# Patient Record
Sex: Male | Born: 1960
Health system: Southern US, Community
[De-identification: ages and names within clinical notes are randomized; demographics above are authoritative.]

## PROBLEM LIST (undated history)

## (undated) DIAGNOSIS — Z860100 Personal history of colon polyps, unspecified: Secondary | ICD-10-CM

## (undated) DIAGNOSIS — K219 Gastro-esophageal reflux disease without esophagitis: Secondary | ICD-10-CM

## (undated) DIAGNOSIS — E785 Hyperlipidemia, unspecified: Secondary | ICD-10-CM

## (undated) DIAGNOSIS — R112 Nausea with vomiting, unspecified: Secondary | ICD-10-CM

## (undated) DIAGNOSIS — Z9889 Other specified postprocedural states: Secondary | ICD-10-CM

## (undated) HISTORY — PX: COLONOSCOPY: SHX5424

## (undated) HISTORY — DX: Personal history of colon polyps, unspecified: Z86.0100

## (undated) HISTORY — PX: EYE SURGERY: SHX253

## (undated) HISTORY — DX: Hyperlipidemia, unspecified: E78.5

## (undated) HISTORY — DX: Personal history of colonic polyps: Z86.010

## (undated) HISTORY — DX: Gastro-esophageal reflux disease without esophagitis: K21.9

## (undated) HISTORY — PX: HERNIA REPAIR: SHX51

## (undated) HISTORY — PX: TONSILLECTOMY: SUR1361

## (undated) HISTORY — PX: RESECTION DISTAL CLAVICAL: SHX5053

## (undated) HISTORY — PX: SHOULDER SURGERY: SHX246

## (undated) HISTORY — PX: WISDOM TOOTH EXTRACTION: SHX21

## (undated) HISTORY — PX: OTHER SURGICAL HISTORY: SHX169

---

## 2003-08-07 ENCOUNTER — Emergency Department (HOSPITAL_COMMUNITY): Admission: EM | Admit: 2003-08-07 | Discharge: 2003-08-07 | Payer: Self-pay
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
## 2003-11-19 ENCOUNTER — Encounter: Admission: RE | Admit: 2003-11-19 | Discharge: 2003-11-19 | Payer: Self-pay | Admitting: Orthopedic Surgery

## 2004-03-21 ENCOUNTER — Ambulatory Visit (HOSPITAL_COMMUNITY): Admission: RE | Admit: 2004-03-21 | Discharge: 2004-03-22 | Payer: Self-pay | Admitting: Orthopedic Surgery

## 2004-03-29 ENCOUNTER — Emergency Department (HOSPITAL_COMMUNITY): Admission: EM | Admit: 2004-03-29 | Discharge: 2004-03-29 | Payer: Self-pay | Admitting: Emergency Medicine

## 2004-03-29 ENCOUNTER — Encounter: Admission: RE | Admit: 2004-03-29 | Discharge: 2004-03-29 | Payer: Self-pay | Admitting: Orthopedic Surgery

## 2004-07-04 ENCOUNTER — Encounter: Admission: RE | Admit: 2004-07-04 | Discharge: 2004-07-04 | Payer: Self-pay | Admitting: Family Medicine

## 2005-03-30 ENCOUNTER — Ambulatory Visit (HOSPITAL_BASED_OUTPATIENT_CLINIC_OR_DEPARTMENT_OTHER): Admission: RE | Admit: 2005-03-30 | Discharge: 2005-03-30 | Payer: Self-pay | Admitting: *Deleted

## 2005-04-19 ENCOUNTER — Encounter: Admission: RE | Admit: 2005-04-19 | Discharge: 2005-04-19 | Payer: Self-pay | Admitting: *Deleted

## 2015-03-20 HISTORY — PX: COLONOSCOPY: SHX5424

## 2015-09-12 DIAGNOSIS — K5730 Diverticulosis of large intestine without perforation or abscess without bleeding: Secondary | ICD-10-CM | POA: Diagnosis not present

## 2015-09-12 DIAGNOSIS — Z86010 Personal history of colonic polyps: Secondary | ICD-10-CM | POA: Diagnosis not present

## 2015-09-12 DIAGNOSIS — D123 Benign neoplasm of transverse colon: Secondary | ICD-10-CM | POA: Diagnosis not present

## 2015-09-12 DIAGNOSIS — K621 Rectal polyp: Secondary | ICD-10-CM | POA: Diagnosis not present

## 2015-09-12 LAB — HM COLONOSCOPY

## 2015-10-18 DIAGNOSIS — M25562 Pain in left knee: Secondary | ICD-10-CM | POA: Diagnosis not present

## 2015-10-18 DIAGNOSIS — K219 Gastro-esophageal reflux disease without esophagitis: Secondary | ICD-10-CM | POA: Diagnosis not present

## 2015-10-18 DIAGNOSIS — H9193 Unspecified hearing loss, bilateral: Secondary | ICD-10-CM | POA: Diagnosis not present

## 2015-10-18 DIAGNOSIS — M25561 Pain in right knee: Secondary | ICD-10-CM | POA: Diagnosis not present

## 2015-12-27 DIAGNOSIS — L821 Other seborrheic keratosis: Secondary | ICD-10-CM | POA: Diagnosis not present

## 2015-12-27 DIAGNOSIS — L820 Inflamed seborrheic keratosis: Secondary | ICD-10-CM | POA: Diagnosis not present

## 2016-01-11 DIAGNOSIS — L821 Other seborrheic keratosis: Secondary | ICD-10-CM | POA: Diagnosis not present

## 2016-01-30 DIAGNOSIS — L821 Other seborrheic keratosis: Secondary | ICD-10-CM | POA: Diagnosis not present

## 2016-06-23 DIAGNOSIS — J019 Acute sinusitis, unspecified: Secondary | ICD-10-CM | POA: Diagnosis not present

## 2016-10-01 DIAGNOSIS — K648 Other hemorrhoids: Secondary | ICD-10-CM | POA: Diagnosis not present

## 2016-10-04 DIAGNOSIS — K648 Other hemorrhoids: Secondary | ICD-10-CM | POA: Diagnosis not present

## 2016-11-12 DIAGNOSIS — T7840XA Allergy, unspecified, initial encounter: Secondary | ICD-10-CM | POA: Diagnosis not present

## 2016-11-12 DIAGNOSIS — T63441A Toxic effect of venom of bees, accidental (unintentional), initial encounter: Secondary | ICD-10-CM | POA: Diagnosis not present

## 2016-12-28 DIAGNOSIS — M79645 Pain in left finger(s): Secondary | ICD-10-CM | POA: Diagnosis not present

## 2016-12-28 DIAGNOSIS — D229 Melanocytic nevi, unspecified: Secondary | ICD-10-CM | POA: Diagnosis not present

## 2016-12-28 DIAGNOSIS — Z23 Encounter for immunization: Secondary | ICD-10-CM | POA: Diagnosis not present

## 2016-12-28 DIAGNOSIS — M79644 Pain in right finger(s): Secondary | ICD-10-CM | POA: Diagnosis not present

## 2017-01-01 DIAGNOSIS — D216 Benign neoplasm of connective and other soft tissue of trunk, unspecified: Secondary | ICD-10-CM | POA: Diagnosis not present

## 2017-01-01 DIAGNOSIS — K30 Functional dyspepsia: Secondary | ICD-10-CM | POA: Diagnosis not present

## 2017-01-01 DIAGNOSIS — D229 Melanocytic nevi, unspecified: Secondary | ICD-10-CM | POA: Diagnosis not present

## 2017-03-19 HISTORY — PX: UPPER GASTROINTESTINAL ENDOSCOPY: SHX188

## 2017-09-05 DIAGNOSIS — R110 Nausea: Secondary | ICD-10-CM | POA: Diagnosis not present

## 2017-09-05 DIAGNOSIS — R1084 Generalized abdominal pain: Secondary | ICD-10-CM | POA: Diagnosis not present

## 2017-09-11 ENCOUNTER — Other Ambulatory Visit: Payer: Self-pay | Admitting: Family Medicine

## 2017-09-11 DIAGNOSIS — R1084 Generalized abdominal pain: Secondary | ICD-10-CM

## 2017-09-30 ENCOUNTER — Ambulatory Visit
Admission: RE | Admit: 2017-09-30 | Discharge: 2017-09-30 | Disposition: A | Discharge status: Home / Self Care | Payer: BLUE CROSS/BLUE SHIELD | Source: Ambulatory Visit | Attending: Family Medicine | Admitting: Family Medicine

## 2017-09-30 DIAGNOSIS — K7689 Other specified diseases of liver: Secondary | ICD-10-CM | POA: Diagnosis not present

## 2017-09-30 DIAGNOSIS — R1084 Generalized abdominal pain: Secondary | ICD-10-CM

## 2017-09-30 MED ORDER — IOPAMIDOL (ISOVUE-300) INJECTION 61%
100.0000 mL | Freq: Once | INTRAVENOUS | Status: AC | PRN
  Administered 2017-09-30: 100 mL via INTRAVENOUS

## 2017-10-23 DIAGNOSIS — R1084 Generalized abdominal pain: Secondary | ICD-10-CM | POA: Diagnosis not present

## 2017-11-04 DIAGNOSIS — Z125 Encounter for screening for malignant neoplasm of prostate: Secondary | ICD-10-CM | POA: Diagnosis not present

## 2017-11-04 DIAGNOSIS — K589 Irritable bowel syndrome without diarrhea: Secondary | ICD-10-CM | POA: Diagnosis not present

## 2017-11-04 DIAGNOSIS — L821 Other seborrheic keratosis: Secondary | ICD-10-CM | POA: Diagnosis not present

## 2017-11-04 DIAGNOSIS — K219 Gastro-esophageal reflux disease without esophagitis: Secondary | ICD-10-CM | POA: Diagnosis not present

## 2017-11-04 DIAGNOSIS — H9190 Unspecified hearing loss, unspecified ear: Secondary | ICD-10-CM | POA: Diagnosis not present

## 2017-11-04 DIAGNOSIS — L570 Actinic keratosis: Secondary | ICD-10-CM | POA: Diagnosis not present

## 2017-11-05 DIAGNOSIS — M9901 Segmental and somatic dysfunction of cervical region: Secondary | ICD-10-CM | POA: Diagnosis not present

## 2017-11-05 DIAGNOSIS — M50122 Cervical disc disorder at C5-C6 level with radiculopathy: Secondary | ICD-10-CM | POA: Diagnosis not present

## 2017-11-05 DIAGNOSIS — M9902 Segmental and somatic dysfunction of thoracic region: Secondary | ICD-10-CM | POA: Diagnosis not present

## 2017-11-07 DIAGNOSIS — M9902 Segmental and somatic dysfunction of thoracic region: Secondary | ICD-10-CM | POA: Diagnosis not present

## 2017-11-07 DIAGNOSIS — M50122 Cervical disc disorder at C5-C6 level with radiculopathy: Secondary | ICD-10-CM | POA: Diagnosis not present

## 2017-11-07 DIAGNOSIS — M9901 Segmental and somatic dysfunction of cervical region: Secondary | ICD-10-CM | POA: Diagnosis not present

## 2017-11-08 DIAGNOSIS — M9901 Segmental and somatic dysfunction of cervical region: Secondary | ICD-10-CM | POA: Diagnosis not present

## 2017-11-08 DIAGNOSIS — M50122 Cervical disc disorder at C5-C6 level with radiculopathy: Secondary | ICD-10-CM | POA: Diagnosis not present

## 2017-11-08 DIAGNOSIS — M9902 Segmental and somatic dysfunction of thoracic region: Secondary | ICD-10-CM | POA: Diagnosis not present

## 2017-11-13 DIAGNOSIS — M9902 Segmental and somatic dysfunction of thoracic region: Secondary | ICD-10-CM | POA: Diagnosis not present

## 2017-11-13 DIAGNOSIS — M9901 Segmental and somatic dysfunction of cervical region: Secondary | ICD-10-CM | POA: Diagnosis not present

## 2017-11-13 DIAGNOSIS — M50122 Cervical disc disorder at C5-C6 level with radiculopathy: Secondary | ICD-10-CM | POA: Diagnosis not present

## 2017-11-15 DIAGNOSIS — M50122 Cervical disc disorder at C5-C6 level with radiculopathy: Secondary | ICD-10-CM | POA: Diagnosis not present

## 2017-11-15 DIAGNOSIS — M9901 Segmental and somatic dysfunction of cervical region: Secondary | ICD-10-CM | POA: Diagnosis not present

## 2017-11-15 DIAGNOSIS — M9902 Segmental and somatic dysfunction of thoracic region: Secondary | ICD-10-CM | POA: Diagnosis not present

## 2017-11-19 DIAGNOSIS — M9901 Segmental and somatic dysfunction of cervical region: Secondary | ICD-10-CM | POA: Diagnosis not present

## 2017-11-19 DIAGNOSIS — M50122 Cervical disc disorder at C5-C6 level with radiculopathy: Secondary | ICD-10-CM | POA: Diagnosis not present

## 2017-11-19 DIAGNOSIS — M9902 Segmental and somatic dysfunction of thoracic region: Secondary | ICD-10-CM | POA: Diagnosis not present

## 2017-11-20 ENCOUNTER — Other Ambulatory Visit: Payer: Self-pay | Admitting: Physician Assistant

## 2017-11-20 DIAGNOSIS — R1013 Epigastric pain: Secondary | ICD-10-CM | POA: Diagnosis not present

## 2017-11-20 DIAGNOSIS — K30 Functional dyspepsia: Secondary | ICD-10-CM | POA: Diagnosis not present

## 2017-11-21 ENCOUNTER — Ambulatory Visit
Admission: RE | Admit: 2017-11-21 | Discharge: 2017-11-21 | Disposition: A | Discharge status: Home / Self Care | Payer: BLUE CROSS/BLUE SHIELD | Source: Ambulatory Visit | Attending: Physician Assistant | Admitting: Physician Assistant

## 2017-11-21 DIAGNOSIS — K760 Fatty (change of) liver, not elsewhere classified: Secondary | ICD-10-CM | POA: Diagnosis not present

## 2017-11-21 DIAGNOSIS — R1013 Epigastric pain: Secondary | ICD-10-CM

## 2017-11-22 DIAGNOSIS — M9902 Segmental and somatic dysfunction of thoracic region: Secondary | ICD-10-CM | POA: Diagnosis not present

## 2017-11-22 DIAGNOSIS — M9901 Segmental and somatic dysfunction of cervical region: Secondary | ICD-10-CM | POA: Diagnosis not present

## 2017-11-22 DIAGNOSIS — M50122 Cervical disc disorder at C5-C6 level with radiculopathy: Secondary | ICD-10-CM | POA: Diagnosis not present

## 2017-11-25 DIAGNOSIS — K228 Other specified diseases of esophagus: Secondary | ICD-10-CM | POA: Diagnosis not present

## 2017-11-25 DIAGNOSIS — K449 Diaphragmatic hernia without obstruction or gangrene: Secondary | ICD-10-CM | POA: Diagnosis not present

## 2017-11-25 DIAGNOSIS — R1033 Periumbilical pain: Secondary | ICD-10-CM | POA: Diagnosis not present

## 2017-11-25 DIAGNOSIS — K209 Esophagitis, unspecified: Secondary | ICD-10-CM | POA: Diagnosis not present

## 2017-11-26 DIAGNOSIS — M9901 Segmental and somatic dysfunction of cervical region: Secondary | ICD-10-CM | POA: Diagnosis not present

## 2017-11-26 DIAGNOSIS — M9902 Segmental and somatic dysfunction of thoracic region: Secondary | ICD-10-CM | POA: Diagnosis not present

## 2017-11-26 DIAGNOSIS — M50122 Cervical disc disorder at C5-C6 level with radiculopathy: Secondary | ICD-10-CM | POA: Diagnosis not present

## 2017-11-28 DIAGNOSIS — M9902 Segmental and somatic dysfunction of thoracic region: Secondary | ICD-10-CM | POA: Diagnosis not present

## 2017-11-28 DIAGNOSIS — M50122 Cervical disc disorder at C5-C6 level with radiculopathy: Secondary | ICD-10-CM | POA: Diagnosis not present

## 2017-11-28 DIAGNOSIS — M9901 Segmental and somatic dysfunction of cervical region: Secondary | ICD-10-CM | POA: Diagnosis not present

## 2017-12-02 ENCOUNTER — Ambulatory Visit (INDEPENDENT_AMBULATORY_CARE_PROVIDER_SITE_OTHER): Payer: BLUE CROSS/BLUE SHIELD | Admitting: Family Medicine

## 2017-12-02 ENCOUNTER — Encounter (INDEPENDENT_AMBULATORY_CARE_PROVIDER_SITE_OTHER): Payer: Self-pay | Admitting: Family Medicine

## 2017-12-02 DIAGNOSIS — R200 Anesthesia of skin: Secondary | ICD-10-CM | POA: Diagnosis not present

## 2017-12-02 DIAGNOSIS — R202 Paresthesia of skin: Secondary | ICD-10-CM

## 2017-12-02 DIAGNOSIS — I808 Phlebitis and thrombophlebitis of other sites: Secondary | ICD-10-CM | POA: Diagnosis not present

## 2017-12-02 MED ORDER — PREDNISONE 10 MG PO TABS: ORAL_TABLET | ORAL | 0 refills | Status: DC

## 2017-12-02 NOTE — Progress Notes (Signed)
   Office Visit Note   Patient: Ian Clark           Date of Birth: Nov 01, 1960           MRN: [id number] Visit Date: 12/02/2017 Requested by: Glenford Bayley, DO [place], [place] [address] PCP: Glenford Bayley, DO  Subjective: Chief Complaint  Patient presents with  . Neck - Pain  . Right Ring Finger - Numbness, Tingling  . Right Little Finger - Numbness, Tingling    HPI: He is a 57 year old right-hand-dominant male seen at the request of Dr. Imagene Sheller for right arm numbness.  Symptoms started about 3 or 4 weeks ago with no injury.  He sees Dr. Imagene Sheller long-term about every 6 weeks for maintenance chiropractic adjustments.  He mentioned that he was having some numbness in his fourth and fifth fingers as well as a little bit of weakness when gripping, and he started going 3 times per week for treatments but his numbness does not seem to be improving.  He denies any neck pain, denies any arm pain.  He cannot think of anything that would have caused this.  He is very active outside, does not spend a lot of time at a computer.  He has never had problems like this before but in the past had issues with left shoulder after clavicle fracture surgery and has done well with maintenance chiropractic since then.              ROS: All other systems were reviewed and are negative.  Objective: Vital Signs: There were no vitals taken for this visit.  Physical Exam:  Right arm: Neck range of motion is full, negative Spurling's test.  No tenderness to palpation in the neck or shoulder.  Full range of motion of the shoulder, elbow, wrist and fingers.  5/5 deltoid, rotator cuff, biceps, triceps, and wrist strength but he does have weakness with adduction of the fifth finger and flexion of the fifth finger compared to the left hand.  There is light touch numbness in the fourth and fifth fingers of the right hand.  Negative Tinel's at the ulnar groove but he does have subluxation of the ulnar nerve with  flexion of the elbow.  This is present in the left elbow as well.  No pain or symptoms with palpation around Guyon's canal.  Imaging: None obtained today.  Per patient report, patient has degenerative changes at C5-6 based on chiropractor x-rays.  Assessment & Plan: 1.  Right fourth and fifth finger numbness and weakness, etiology uncertain. - Prednisone taper.  Continue chiropractic.  Nerve studies if not improving in 3-4 weeks.   Follow-Up Instructions: No follow-ups on file.       Procedures: None   PMFS History: There are no active problems to display for this patient.  History reviewed. No pertinent past medical history.  History reviewed. No pertinent family history.  History reviewed. No pertinent surgical history. Social History   Occupational History  . Not on file  Tobacco Use  . Smoking status: Never Smoker  . Smokeless tobacco: Never Used  Substance and Sexual Activity  . Alcohol use: Not on file  . Drug use: Not on file  . Sexual activity: Not on file

## 2017-12-25 DIAGNOSIS — K449 Diaphragmatic hernia without obstruction or gangrene: Secondary | ICD-10-CM | POA: Diagnosis not present

## 2017-12-25 DIAGNOSIS — K210 Gastro-esophageal reflux disease with esophagitis: Secondary | ICD-10-CM | POA: Diagnosis not present

## 2018-01-06 ENCOUNTER — Telehealth (INDEPENDENT_AMBULATORY_CARE_PROVIDER_SITE_OTHER): Payer: Self-pay | Admitting: Family Medicine

## 2018-01-06 NOTE — Telephone Encounter (Signed)
Please advise thanks.

## 2018-01-06 NOTE — Telephone Encounter (Signed)
Patient left a message stating that the oral steroids did not work and wants to know what the next step would be.  CB#[phone].  Thank you.

## 2018-01-07 ENCOUNTER — Other Ambulatory Visit (INDEPENDENT_AMBULATORY_CARE_PROVIDER_SITE_OTHER): Payer: Self-pay | Admitting: Family Medicine

## 2018-01-07 DIAGNOSIS — R202 Paresthesia of skin: Principal | ICD-10-CM

## 2018-01-07 DIAGNOSIS — R200 Anesthesia of skin: Secondary | ICD-10-CM

## 2018-01-07 NOTE — Telephone Encounter (Signed)
IC to discuss. No answer. LMVM advising per Dr Junius Roads.

## 2018-01-07 NOTE — Telephone Encounter (Signed)
I ordered nerve conduction studies per Dr. Ernestina Patches.

## 2018-01-21 ENCOUNTER — Ambulatory Visit (INDEPENDENT_AMBULATORY_CARE_PROVIDER_SITE_OTHER): Payer: BLUE CROSS/BLUE SHIELD | Admitting: Physical Medicine and Rehabilitation

## 2018-01-21 ENCOUNTER — Encounter (INDEPENDENT_AMBULATORY_CARE_PROVIDER_SITE_OTHER): Payer: Self-pay | Admitting: Physical Medicine and Rehabilitation

## 2018-01-21 DIAGNOSIS — R202 Paresthesia of skin: Secondary | ICD-10-CM | POA: Diagnosis not present

## 2018-01-21 NOTE — Progress Notes (Signed)
Spoke with patient - cancelled the appointment on 01/27/18 with Dr. Junius Roads and rescheduled with Dr. Erlinda Hong on 01/28/18 at 3:15 pm.

## 2018-01-21 NOTE — Procedures (Signed)
EMG & NCV Findings: Evaluation of the right ulnar motor nerve showed decreased conduction velocity (A Elbow-B Elbow, 26 m/s).  The right median (across palm) sensory nerve showed prolonged distal peak latency (Wrist, 4.3 ms) and prolonged distal peak latency (Palm, 2.1 ms).  The right ulnar sensory nerve showed prolonged distal peak latency (4.1 ms), reduced amplitude (9.5 V), and decreased conduction velocity (Wrist-5th Digit, 34 m/s).  All remaining nerves (as indicated in the following tables) were within normal limits.    Needle evaluation of the right first dorsal interosseous muscle showed increased insertional activity and moderately increased spontaneous activity.  The right flexor digitorum profundus and the right flexor carpi ulnaris muscles showed increased insertional activity and increased spontaneous activity.  All remaining muscles (as indicated in the following table) showed no evidence of electrical instability.    Impression: The above electrodiagnostic study is ABNORMAL and reveals evidence of:  1. A severe right ulnar nerve entrapment at the elbow (cubital tunnel syndrome) affecting sensory and motor components.   2. A mild right median nerve entrapment at the wrist affecting sensory components.  This appears asymptomatic.  There is no significant electrodiagnostic evidence of any other focal nerve entrapment, brachial plexopathy or cervical radiculopathy.   Recommendations: 1.  Follow-up with referring physician. 2.  Continue current management of symptoms. 3.  Suggest surgical evaluation.  ___________________________ Laurence Spates FAAPMR Board Certified, American Board of Physical Medicine and Rehabilitation    Nerve Conduction Studies Anti Sensory Summary Table   Stim Site NR Peak (ms) Norm Peak (ms) P-T Amp (V) Norm P-T Amp Site1 Site2 Delta-P (ms) Dist (cm) Vel (m/s) Norm Vel (m/s)  Right Median Acr Palm Anti Sensory (2nd Digit)  32.3C  Wrist    *4.3 <3.6 28.2  >10 Wrist Palm 2.2 0.0    Palm    *2.1 <2.0 31.8         Right Radial Anti Sensory (Base 1st Digit)  33.5C  Wrist    2.0 <3.1 15.7  Wrist Base 1st Digit 2.0 0.0    Right Ulnar Anti Sensory (5th Digit)  32.4C  Wrist    *4.1 <3.7 *9.5 >15.0 Wrist 5th Digit 4.1 14.0 *34 >38   Motor Summary Table   Stim Site NR Onset (ms) Norm Onset (ms) O-P Amp (mV) Norm O-P Amp Site1 Site2 Delta-0 (ms) Dist (cm) Vel (m/s) Norm Vel (m/s)  Right Median Motor (Abd Poll Brev)  33.3C  Wrist    4.2 <4.2 5.5 >5 Elbow Wrist 4.1 23.0 56 >50  Elbow    8.3  6.4         Right Ulnar Motor (Abd Dig Min)  33C  Wrist    3.8 <4.2 8.7 >3 B Elbow Wrist 3.9 22.5 58 >53  B Elbow    7.7  2.9  A Elbow B Elbow 3.7 9.5 *26 >53  A Elbow    11.4  2.2          EMG   Side Muscle Nerve Root Ins Act Fibs Psw Amp Dur Poly Recrt Int Fraser Din Comment  Right 1stDorInt Ulnar C8-T1 *Incr *2+ *2+ Nml Nml 0 Nml Nml   Right Abd Poll Brev Median C8-T1 Nml Nml Nml Nml Nml 0 Nml Nml   Right ExtDigCom   Nml Nml Nml Nml Nml 0 Nml Nml   Right Triceps Radial C6-7-8 Nml Nml Nml Nml Nml 0 Nml Nml   Right FlexDigProf Ulnar C8,T1 *Incr *3+ *3+ Nml Nml 0 Nml Nml  Right FlexCarpiUln Ulnar C8,T1 *Incr *3+ *3+ Nml Nml 0 Nml Nml     Nerve Conduction Studies Anti Sensory Left/Right Comparison   Stim Site L Lat (ms) R Lat (ms) L-R Lat (ms) L Amp (V) R Amp (V) L-R Amp (%) Site1 Site2 L Vel (m/s) R Vel (m/s) L-R Vel (m/s)  Median Acr Palm Anti Sensory (2nd Digit)  32.3C  Wrist  *4.3   28.2  Wrist Palm     Palm  *2.1   31.8        Radial Anti Sensory (Base 1st Digit)  33.5C  Wrist  2.0   15.7  Wrist Base 1st Digit     Ulnar Anti Sensory (5th Digit)  32.4C  Wrist  *4.1   *9.5  Wrist 5th Digit  *34    Motor Left/Right Comparison   Stim Site L Lat (ms) R Lat (ms) L-R Lat (ms) L Amp (mV) R Amp (mV) L-R Amp (%) Site1 Site2 L Vel (m/s) R Vel (m/s) L-R Vel (m/s)  Median Motor (Abd Poll Brev)  33.3C  Wrist  4.2   5.5  Elbow Wrist  56   Elbow  8.3    6.4        Ulnar Motor (Abd Dig Min)  33C  Wrist  3.8   8.7  B Elbow Wrist  58   B Elbow  7.7   2.9  A Elbow B Elbow  *26   A Elbow  11.4   2.2           Waveforms:

## 2018-01-21 NOTE — Progress Notes (Signed)
Ian Clark - 57 y.o. male MRN [id number]  Date of birth: 08-04-1960  Office Visit Note: Visit Date: 01/21/2018 PCP: Glenford Bayley, DO Referred by: Glenford Bayley, DO  Subjective: Chief Complaint  Patient presents with  . Right Hand - Pain   HPI:  DELDRICK LINCH is a 57 y.o. male who comes in today At the request of Dr. Eunice Blase for electrodiagnostic study of the right upper limb.  The patient is right-hand dominant and complains of medium months of progressive worsening right arm with numbness and tingling in the fifth digit and part of the fourth digit on the ulnar side.  This is been really severe and worsening over the last 2 months with progressive numbness.  He does not really endorse much in the way of pain just numbness.  He says he started losing some dexterity and strength with a little finger.  He does not note any specific trauma.  He has no left-sided complaints.  No tingling or numbness in the radial digits.  He denies any frank neck pain or radicular pain.  He has tried chiropractic care and medication management without relief.  He does endorse sitting at the bar that he has in his basement for a good length of time resting in his right elbow on the bar while he talks.  Has any prior electrodiagnostic studies.  ROS Otherwise per HPI.  Assessment & Plan: Visit Diagnoses:  1. Paresthesia of skin     Plan: Impression: The above electrodiagnostic study is ABNORMAL and reveals evidence of:  1. A severe right ulnar nerve entrapment at the elbow (cubital tunnel syndrome) affecting sensory and motor components.   2. A mild right median nerve entrapment at the wrist affecting sensory components.  This appears asymptomatic.  There is no significant electrodiagnostic evidence of any other focal nerve entrapment, brachial plexopathy or cervical radiculopathy.   Recommendations: 1.  Follow-up with referring physician. 2.  Continue current management of symptoms. 3.  Suggest  surgical evaluation.   Meds & Orders: No orders of the defined types were placed in this encounter.   Orders Placed This Encounter  Procedures  . NCV with EMG (electromyography)    Follow-up: Return for Eunice Blase, MD.   Procedures: No procedures performed  EMG & NCV Findings: Evaluation of the right ulnar motor nerve showed decreased conduction velocity (A Elbow-B Elbow, 26 m/s).  The right median (across palm) sensory nerve showed prolonged distal peak latency (Wrist, 4.3 ms) and prolonged distal peak latency (Palm, 2.1 ms).  The right ulnar sensory nerve showed prolonged distal peak latency (4.1 ms), reduced amplitude (9.5 V), and decreased conduction velocity (Wrist-5th Digit, 34 m/s).  All remaining nerves (as indicated in the following tables) were within normal limits.    Needle evaluation of the right first dorsal interosseous muscle showed increased insertional activity and moderately increased spontaneous activity.  The right flexor digitorum profundus and the right flexor carpi ulnaris muscles showed increased insertional activity and increased spontaneous activity.  All remaining muscles (as indicated in the following table) showed no evidence of electrical instability.    Impression: The above electrodiagnostic study is ABNORMAL and reveals evidence of:  1. A severe right ulnar nerve entrapment at the elbow (cubital tunnel syndrome) affecting sensory and motor components.   2. A mild right median nerve entrapment at the wrist affecting sensory components.  This appears asymptomatic.  There is no significant electrodiagnostic evidence of any other focal nerve entrapment, brachial  plexopathy or cervical radiculopathy.   Recommendations: 1.  Follow-up with referring physician. 2.  Continue current management of symptoms. 3.  Suggest surgical evaluation.  ___________________________ Laurence Spates FAAPMR Board Certified, American Board of Physical Medicine and  Rehabilitation    Nerve Conduction Studies Anti Sensory Summary Table   Stim Site NR Peak (ms) Norm Peak (ms) P-T Amp (V) Norm P-T Amp Site1 Site2 Delta-P (ms) Dist (cm) Vel (m/s) Norm Vel (m/s)  Right Median Acr Palm Anti Sensory (2nd Digit)  32.3C  Wrist    *4.3 <3.6 28.2 >10 Wrist Palm 2.2 0.0    Palm    *2.1 <2.0 31.8         Right Radial Anti Sensory (Base 1st Digit)  33.5C  Wrist    2.0 <3.1 15.7  Wrist Base 1st Digit 2.0 0.0    Right Ulnar Anti Sensory (5th Digit)  32.4C  Wrist    *4.1 <3.7 *9.5 >15.0 Wrist 5th Digit 4.1 14.0 *34 >38   Motor Summary Table   Stim Site NR Onset (ms) Norm Onset (ms) O-P Amp (mV) Norm O-P Amp Site1 Site2 Delta-0 (ms) Dist (cm) Vel (m/s) Norm Vel (m/s)  Right Median Motor (Abd Poll Brev)  33.3C  Wrist    4.2 <4.2 5.5 >5 Elbow Wrist 4.1 23.0 56 >50  Elbow    8.3  6.4         Right Ulnar Motor (Abd Dig Min)  33C  Wrist    3.8 <4.2 8.7 >3 B Elbow Wrist 3.9 22.5 58 >53  B Elbow    7.7  2.9  A Elbow B Elbow 3.7 9.5 *26 >53  A Elbow    11.4  2.2          EMG   Side Muscle Nerve Root Ins Act Fibs Psw Amp Dur Poly Recrt Int Fraser Din Comment  Right 1stDorInt Ulnar C8-T1 *Incr *2+ *2+ Nml Nml 0 Nml Nml   Right Abd Poll Brev Median C8-T1 Nml Nml Nml Nml Nml 0 Nml Nml   Right ExtDigCom   Nml Nml Nml Nml Nml 0 Nml Nml   Right Triceps Radial C6-7-8 Nml Nml Nml Nml Nml 0 Nml Nml   Right FlexDigProf Ulnar C8,T1 *Incr *3+ *3+ Nml Nml 0 Nml Nml   Right FlexCarpiUln Ulnar C8,T1 *Incr *3+ *3+ Nml Nml 0 Nml Nml     Nerve Conduction Studies Anti Sensory Left/Right Comparison   Stim Site L Lat (ms) R Lat (ms) L-R Lat (ms) L Amp (V) R Amp (V) L-R Amp (%) Site1 Site2 L Vel (m/s) R Vel (m/s) L-R Vel (m/s)  Median Acr Palm Anti Sensory (2nd Digit)  32.3C  Wrist  *4.3   28.2  Wrist Palm     Palm  *2.1   31.8        Radial Anti Sensory (Base 1st Digit)  33.5C  Wrist  2.0   15.7  Wrist Base 1st Digit     Ulnar Anti Sensory (5th Digit)  32.4C  Wrist  *4.1    *9.5  Wrist 5th Digit  *34    Motor Left/Right Comparison   Stim Site L Lat (ms) R Lat (ms) L-R Lat (ms) L Amp (mV) R Amp (mV) L-R Amp (%) Site1 Site2 L Vel (m/s) R Vel (m/s) L-R Vel (m/s)  Median Motor (Abd Poll Brev)  33.3C  Wrist  4.2   5.5  Elbow Wrist  56   Elbow  8.3   6.4  Ulnar Motor (Abd Dig Min)  33C  Wrist  3.8   8.7  B Elbow Wrist  58   B Elbow  7.7   2.9  A Elbow B Elbow  *26   A Elbow  11.4   2.2           Waveforms:            Clinical History: No specialty comments available.     Objective:  VS:  HT:    WT:   BMI:     BP:   HR: bpm  TEMP: ( )  RESP:  Physical Exam  Constitutional: He is oriented to person, place, and time.  Musculoskeletal: He exhibits no edema or tenderness.  Inspection reveals positive Wartenberg sign on the right hand some atrophy of the first webspace on the right compared to left but no atrophy of the bilateral APB or hand intrinsics. There is no swelling, color changes, allodynia or dystrophic changes. There is 5 out of 5 strength in the bilateral wrist extension and long finger flexion,but mild weakness with finger abduction on the right.  There is decreased sensation to light touch in the ulnar distribution on the right compared to left.  There is a positive Froment sign on the right.  There is a positive Tinel's at the left elbow negative at the wrist  Neurological: He is alert and oriented to person, place, and time. He exhibits normal muscle tone. Coordination abnormal.  Skin: Skin is warm and dry. No rash noted. No erythema.    Ortho Exam Imaging: No results found.

## 2018-01-21 NOTE — Progress Notes (Signed)
 .  Numeric Pain Rating Scale and Functional Assessment Average Pain 0   In the last MONTH (on 0-10 scale) has pain interfered with the following?  1. General activity like being  able to carry out your everyday physical activities such as walking, climbing stairs, carrying groceries, or moving a chair?  Rating(0)

## 2018-01-23 NOTE — Progress Notes (Signed)
Patient has appointment scheduled for Tuesday, 01/28/2018 with Dr. Erlinda Hong.

## 2018-01-27 ENCOUNTER — Ambulatory Visit (INDEPENDENT_AMBULATORY_CARE_PROVIDER_SITE_OTHER): Payer: Self-pay | Admitting: Family Medicine

## 2018-01-28 ENCOUNTER — Ambulatory Visit (INDEPENDENT_AMBULATORY_CARE_PROVIDER_SITE_OTHER): Payer: BLUE CROSS/BLUE SHIELD | Admitting: Orthopaedic Surgery

## 2018-01-28 DIAGNOSIS — G5621 Lesion of ulnar nerve, right upper limb: Secondary | ICD-10-CM | POA: Diagnosis not present

## 2018-01-28 NOTE — Progress Notes (Signed)
   Office Visit Note   Patient: Ian Clark           Date of Birth: 1960/08/11           MRN: [id number] Visit Date: 01/28/2018              Requested by: Glenford Bayley, DO [place], [place] [address] PCP: Glenford Bayley, DO   Assessment & Plan: Visit Diagnoses:  1. Cubital tunnel syndrome on right     Plan: Impression is severe right cubital tunnel syndrome and mild right carpal tunnel syndrome.  He is not really symptomatic from his right carpal tunnel syndrome.  He is very symptomatic from his cubital tunnel syndrome.  Recommendation is for cubital tunnel release.  We discussed the surgery in detail including risks and benefits and rehab and recovery and the goals of surgery.  He understands and wishes to proceed in the near future.  Follow-Up Instructions: Return for 2 week postop visit.   Orders:  No orders of the defined types were placed in this encounter.  No orders of the defined types were placed in this encounter.     Procedures: No procedures performed   Clinical Data: No additional findings.   Subjective: Chief Complaint  Patient presents with  . Right Hand - Follow-up    Per [place],patient had abnormal EMG/NCS of RUE    Ian Clark comes in today for referral of his right cubital tunnel syndrome and mild carpal tunnel syndrome.  These were recently found on nerve conduction studies.  He is right-hand dominant gentleman and he has noticed hand dysfunction in terms of strength and dexterity in the last 2 months.  Denies any injuries.  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  Denies any radiation of pain.  He does endorse numbness in his ulnar nerve symptoms.  He denies any carpal tunnel symptoms.   Review of Systems  Constitutional: Negative.   All other systems reviewed and are negative.    Objective: Vital Signs: There were no vitals taken for this visit.  Physical Exam  Constitutional: He is oriented to person, place, and time.  He appears well-developed and well-nourished.  HENT:  Head: Normocephalic and atraumatic.  Eyes: Pupils are equal, round, and reactive to light.  Neck: Neck supple.  Pulmonary/Chest: Effort normal.  Abdominal: Soft.  Musculoskeletal: Normal range of motion.  Neurological: He is alert and oriented to person, place, and time.  Skin: Skin is warm.  Psychiatric: He has a normal mood and affect. His behavior is normal. Judgment and thought content normal.  Nursing note and vitals reviewed.   Ortho Exam Right hand exam shows intrinsic muscular atrophy.  Negative Wartenberg.  Positive Jeannie sign.  Positive Tinel at the cubital tunnel.  Ulnar nerve is stable.  Positive pain at the elbow with flexion. Specialty Comments:  No specialty comments available.  Imaging: No results found.   PMFS History: There are no active problems to display for this patient.  No past medical history on file.  No family history on file.  No past surgical history on file. Social History   Occupational History  . Not on file  Tobacco Use  . Smoking status: Never Smoker  . Smokeless tobacco: Never Used  Substance and Sexual Activity  . Alcohol use: Not on file  . Drug use: Not on file  . Sexual activity: Not on file

## 2018-02-27 DIAGNOSIS — G5621 Lesion of ulnar nerve, right upper limb: Secondary | ICD-10-CM | POA: Diagnosis not present

## 2018-03-13 ENCOUNTER — Encounter (INDEPENDENT_AMBULATORY_CARE_PROVIDER_SITE_OTHER): Payer: Self-pay | Admitting: Orthopaedic Surgery

## 2018-03-13 ENCOUNTER — Ambulatory Visit (INDEPENDENT_AMBULATORY_CARE_PROVIDER_SITE_OTHER): Payer: BLUE CROSS/BLUE SHIELD | Admitting: Orthopaedic Surgery

## 2018-03-13 DIAGNOSIS — G5621 Lesion of ulnar nerve, right upper limb: Secondary | ICD-10-CM

## 2018-03-13 NOTE — Progress Notes (Signed)
   Post-Op Visit Note   Patient: Ian Clark           Date of Birth: [birth year]           MRN: [id number] Visit Date: 03/13/2018 PCP: Glenford Bayley, DO   Assessment & Plan:  Chief Complaint:  Chief Complaint  Patient presents with  . Right Elbow - Pain   Visit Diagnoses:  1. Cubital tunnel syndrome on right     Plan: Mr. Divelbiss is two-week status post right cubital tunnel release and ulnar nerve transposition.  He is doing well.  He reports minimal pain.  His symptoms related to the cubital tunnel syndrome are stable.  He is not taking any pain medicines.  His surgical incision is fully healed.  No signs of infection.  He has great elbow range of motion.  No subluxation of the ulnar nerve.  At this point we remove the sutures and allow him to begin hand strengthening exercises.  I do not feel that he needs formal physical therapy.  We will see him back in a month for recheck.  For the next 2 weeks he needs to limit his lifting to less than 10 pounds.  Follow-Up Instructions: Return in about 4 weeks (around 04/10/2018).   Orders:  No orders of the defined types were placed in this encounter.  No orders of the defined types were placed in this encounter.   Imaging: No results found.  PMFS History: There are no active problems to display for this patient.  History reviewed. No pertinent past medical history.  History reviewed. No pertinent family history.  History reviewed. No pertinent surgical history. Social History   Occupational History  . Not on file  Tobacco Use  . Smoking status: Never Smoker  . Smokeless tobacco: Never Used  Substance and Sexual Activity  . Alcohol use: Not on file  . Drug use: Not on file  . Sexual activity: Not on file

## 2018-04-10 ENCOUNTER — Ambulatory Visit (INDEPENDENT_AMBULATORY_CARE_PROVIDER_SITE_OTHER): Payer: BLUE CROSS/BLUE SHIELD | Admitting: Orthopaedic Surgery

## 2018-04-10 ENCOUNTER — Ambulatory Visit (INDEPENDENT_AMBULATORY_CARE_PROVIDER_SITE_OTHER): Payer: BLUE CROSS/BLUE SHIELD | Admitting: Physician Assistant

## 2018-04-10 DIAGNOSIS — Z9889 Other specified postprocedural states: Secondary | ICD-10-CM

## 2018-04-10 NOTE — Progress Notes (Signed)
   Post-Op Visit Note   Patient: Ian Clark           Date of Birth: 12-Aug-1960           MRN: [id number] Visit Date: 04/10/2018 PCP: Ian Bayley, DO   Assessment & Plan:  Chief Complaint:  Chief Complaint  Patient presents with  . Right Elbow - Pain   Visit Diagnoses:  1. S/P cubital tunnel release     Plan: Patient is a pleasant 58 year old gentleman presents our clinic today 6 weeks status post right elbow cubital tunnel release and ulnar nerve decompression, date of surgery 02/27/2018.  He has been doing fairly well.  He has minimal to no pain.  He still notes no sensation to the small finger and decreased sensation to the ring finger.  He is regaining dexterity and strength as each day passes.  Examination of the right elbow reveals full range of motion.  Full grip strength.  Decreased sensation to the ring and small fingers.  At this point, he will continue to advance with activity as tolerated.  Follow-up with [place] in 6 weeks time for recheck.    Follow-Up Instructions: Return in about 6 weeks (around 05/22/2018).   Orders:  No orders of the defined types were placed in this encounter.  No orders of the defined types were placed in this encounter.   Imaging: No new imaging  PMFS History: Patient Active Problem List   Diagnosis Date Noted  . S/P cubital tunnel release 04/10/2018   No past medical history on file.  No family history on file.  No past surgical history on file. Social History   Occupational History  . Not on file  Tobacco Use  . Smoking status: Never Smoker  . Smokeless tobacco: Never Used  Substance and Sexual Activity  . Alcohol use: Not on file  . Drug use: Not on file  . Sexual activity: Not on file

## 2018-04-15 DIAGNOSIS — H33312 Horseshoe tear of retina without detachment, left eye: Secondary | ICD-10-CM | POA: Diagnosis not present

## 2018-04-18 DIAGNOSIS — H33312 Horseshoe tear of retina without detachment, left eye: Secondary | ICD-10-CM | POA: Diagnosis not present

## 2018-05-27 ENCOUNTER — Encounter (INDEPENDENT_AMBULATORY_CARE_PROVIDER_SITE_OTHER): Payer: Self-pay | Admitting: Orthopaedic Surgery

## 2018-05-27 ENCOUNTER — Ambulatory Visit (INDEPENDENT_AMBULATORY_CARE_PROVIDER_SITE_OTHER): Payer: BLUE CROSS/BLUE SHIELD | Admitting: Orthopaedic Surgery

## 2018-05-27 DIAGNOSIS — Z9889 Other specified postprocedural states: Secondary | ICD-10-CM

## 2018-05-27 NOTE — Progress Notes (Signed)
   Post-Op Visit Note   Patient: Ian Clark           Date of Birth: Jun 01, 1960           MRN: [id number] Visit Date: 05/27/2018 PCP: Glenford Bayley, DO   Assessment & Plan:  Chief Complaint:  Chief Complaint  Patient presents with  . Right Hand - Pain   Visit Diagnoses:  1. S/P cubital tunnel release     Plan: Kaulin is almost 3 months status post right cubital tunnel release and transposition.  He is doing well overall.  He has returned back to normal activities.  He does feel like he starting to have some improvement in his ulnar nerve symptoms.  His surgical scar is fully healed with some mild tenderness to the scar.  There is no evidence of infection.  His ulnar nerve is stable.  Minimal swelling.  From my standpoint he is doing well he is very happy with his recovery.  He is released to full activity as tolerated.  Follow-up as needed.  Follow-Up Instructions: Return if symptoms worsen or fail to improve.   Orders:  No orders of the defined types were placed in this encounter.  No orders of the defined types were placed in this encounter.   Imaging: No results found.  PMFS History: Patient Active Problem List   Diagnosis Date Noted  . S/P cubital tunnel release 04/10/2018   History reviewed. No pertinent past medical history.  History reviewed. No pertinent family history.  History reviewed. No pertinent surgical history. Social History   Occupational History  . Not on file  Tobacco Use  . Smoking status: Never Smoker  . Smokeless tobacco: Never Used  Substance and Sexual Activity  . Alcohol use: Not on file  . Drug use: Not on file  . Sexual activity: Not on file

## 2018-11-03 DIAGNOSIS — L259 Unspecified contact dermatitis, unspecified cause: Secondary | ICD-10-CM | POA: Diagnosis not present

## 2018-12-10 DIAGNOSIS — S83411D Sprain of medial collateral ligament of right knee, subsequent encounter: Secondary | ICD-10-CM | POA: Diagnosis not present

## 2019-01-05 DIAGNOSIS — K449 Diaphragmatic hernia without obstruction or gangrene: Secondary | ICD-10-CM | POA: Diagnosis not present

## 2019-01-05 DIAGNOSIS — K2100 Gastro-esophageal reflux disease with esophagitis, without bleeding: Secondary | ICD-10-CM | POA: Diagnosis not present

## 2019-01-05 DIAGNOSIS — Z86010 Personal history of colonic polyps: Secondary | ICD-10-CM | POA: Diagnosis not present

## 2019-01-13 DIAGNOSIS — Z23 Encounter for immunization: Secondary | ICD-10-CM | POA: Diagnosis not present

## 2019-02-23 DIAGNOSIS — L814 Other melanin hyperpigmentation: Secondary | ICD-10-CM | POA: Diagnosis not present

## 2019-02-23 DIAGNOSIS — L821 Other seborrheic keratosis: Secondary | ICD-10-CM | POA: Diagnosis not present

## 2019-02-23 DIAGNOSIS — D1801 Hemangioma of skin and subcutaneous tissue: Secondary | ICD-10-CM | POA: Diagnosis not present

## 2019-02-23 DIAGNOSIS — L570 Actinic keratosis: Secondary | ICD-10-CM | POA: Diagnosis not present

## 2019-02-23 DIAGNOSIS — L579 Skin changes due to chronic exposure to nonionizing radiation, unspecified: Secondary | ICD-10-CM | POA: Diagnosis not present

## 2019-02-25 DIAGNOSIS — Z0189 Encounter for other specified special examinations: Secondary | ICD-10-CM | POA: Diagnosis not present

## 2019-02-25 DIAGNOSIS — K589 Irritable bowel syndrome without diarrhea: Secondary | ICD-10-CM | POA: Diagnosis not present

## 2019-02-25 DIAGNOSIS — Z0001 Encounter for general adult medical examination with abnormal findings: Secondary | ICD-10-CM | POA: Diagnosis not present

## 2019-02-25 DIAGNOSIS — K219 Gastro-esophageal reflux disease without esophagitis: Secondary | ICD-10-CM | POA: Diagnosis not present

## 2019-02-25 DIAGNOSIS — N529 Male erectile dysfunction, unspecified: Secondary | ICD-10-CM | POA: Diagnosis not present

## 2019-02-25 DIAGNOSIS — Z23 Encounter for immunization: Secondary | ICD-10-CM | POA: Diagnosis not present

## 2019-04-22 DIAGNOSIS — H04123 Dry eye syndrome of bilateral lacrimal glands: Secondary | ICD-10-CM | POA: Diagnosis not present

## 2019-05-18 DIAGNOSIS — K449 Diaphragmatic hernia without obstruction or gangrene: Secondary | ICD-10-CM | POA: Insufficient documentation

## 2019-07-28 DIAGNOSIS — Z8719 Personal history of other diseases of the digestive system: Secondary | ICD-10-CM | POA: Diagnosis not present

## 2019-07-28 DIAGNOSIS — R1032 Left lower quadrant pain: Secondary | ICD-10-CM | POA: Diagnosis not present

## 2019-07-28 DIAGNOSIS — Z9889 Other specified postprocedural states: Secondary | ICD-10-CM | POA: Diagnosis not present

## 2019-07-31 DIAGNOSIS — Z23 Encounter for immunization: Secondary | ICD-10-CM | POA: Diagnosis not present

## 2019-07-31 DIAGNOSIS — I4892 Unspecified atrial flutter: Secondary | ICD-10-CM | POA: Diagnosis not present

## 2019-09-03 DIAGNOSIS — R1032 Left lower quadrant pain: Secondary | ICD-10-CM | POA: Diagnosis not present

## 2019-09-23 ENCOUNTER — Other Ambulatory Visit: Payer: Self-pay | Admitting: Surgery

## 2019-09-23 DIAGNOSIS — R1032 Left lower quadrant pain: Secondary | ICD-10-CM

## 2019-09-24 ENCOUNTER — Ambulatory Visit
Admission: RE | Admit: 2019-09-24 | Discharge: 2019-09-24 | Disposition: A | Discharge status: Home / Self Care | Payer: BLUE CROSS/BLUE SHIELD | Source: Ambulatory Visit | Attending: Surgery | Admitting: Surgery

## 2019-09-24 DIAGNOSIS — R1032 Left lower quadrant pain: Secondary | ICD-10-CM

## 2019-09-24 DIAGNOSIS — I700 Atherosclerosis of aorta: Secondary | ICD-10-CM | POA: Diagnosis not present

## 2019-09-24 DIAGNOSIS — K7689 Other specified diseases of liver: Secondary | ICD-10-CM | POA: Diagnosis not present

## 2019-09-24 DIAGNOSIS — I708 Atherosclerosis of other arteries: Secondary | ICD-10-CM | POA: Diagnosis not present

## 2019-09-24 DIAGNOSIS — M47816 Spondylosis without myelopathy or radiculopathy, lumbar region: Secondary | ICD-10-CM | POA: Diagnosis not present

## 2019-09-24 MED ORDER — IOPAMIDOL (ISOVUE-300) INJECTION 61%
100.0000 mL | Freq: Once | INTRAVENOUS | Status: AC | PRN
  Administered 2019-09-24: 100 mL via INTRAVENOUS

## 2019-10-06 ENCOUNTER — Other Ambulatory Visit: Payer: BLUE CROSS/BLUE SHIELD

## 2019-10-08 ENCOUNTER — Ambulatory Visit: Payer: Self-pay | Admitting: Surgery

## 2019-11-03 NOTE — Progress Notes (Addendum)
[id number] - [place], [place] - [address] [place]  [address] Phone: [phone] Fax: [phone]      Your procedure is scheduled on August 23  Report to [facility] Main Entrance "A" at 0800 A.M., and check in at the Admitting office.  Call this number if you have problems the morning of surgery:  [phone]  Call [phone] if you have any questions prior to your surgery date Monday-Friday 8am-4pm    Remember:  Do not eat after midnight the night before your surgery  You may drink clear liquids until 0700 am the morning of your surgery.   Clear liquids allowed are: Water, Non-Citrus Juices (without pulp), Carbonated Beverages, Clear Tea, Black Coffee Only, and Gatorade    Take these medicines the morning of surgery with A SIP OF WATER  pantoprazole (PROTONIX)   As of today, STOP taking any Aspirin (unless otherwise instructed by your surgeon) Aleve, Naproxen, Ibuprofen, Motrin, Advil, Goody's, BC's, all herbal medications, fish oil, and all vitamins.                      Do not wear jewelry            Do not wear lotions, powders, colognes, or deodorant.            Men may shave face and neck.            Do not bring valuables to the hospital.            [facility] is not responsible for any belongings or valuables.  Do NOT Smoke (Tobacco/Vaping) or drink Alcohol 24 hours prior to your procedure If you use a CPAP at night, you may bring all equipment for your overnight stay.   Contacts, glasses, dentures or bridgework may not be worn into surgery.      For patients admitted to the hospital, discharge time will be determined by your treatment team.   Patients discharged the day of surgery will not be allowed to drive home, and someone needs to stay with them for 24 hours.    Special instructions:   [place]- Preparing For Surgery  Before surgery, you can play an important role. Because skin is not sterile,  your skin needs to be as free of germs as possible. You can reduce the number of germs on your skin by washing with CHG (chlorahexidine gluconate) Soap before surgery.  CHG is an antiseptic cleaner which kills germs and bonds with the skin to continue killing germs even after washing.    Oral Hygiene is also important to reduce your risk of infection.  Remember - BRUSH YOUR TEETH THE MORNING OF SURGERY WITH YOUR REGULAR TOOTHPASTE  Please do not use if you have an allergy to CHG or antibacterial soaps. If your skin becomes reddened/irritated stop using the CHG.  Do not shave (including legs and underarms) for at least 48 hours prior to first CHG shower. It is OK to shave your face.  Please follow these instructions carefully.   1. Shower the NIGHT BEFORE SURGERY and the MORNING OF SURGERY with CHG Soap.   2. If you chose to wash your hair, wash your hair first as usual with your normal shampoo.  3. After you shampoo, rinse your hair and body thoroughly to remove the shampoo.  4. Use CHG as you would any other liquid soap. You can apply CHG directly to the  skin and wash gently with a scrungie or a clean washcloth.   5. Apply the CHG Soap to your body ONLY FROM THE NECK DOWN.  Do not use on open wounds or open sores. Avoid contact with your eyes, ears, mouth and genitals (private parts). Wash Face and genitals (private parts)  with your normal soap.   6. Wash thoroughly, paying special attention to the area where your surgery will be performed.  7. Thoroughly rinse your body with warm water from the neck down.  8. DO NOT shower/wash with your normal soap after using and rinsing off the CHG Soap.  9. Pat yourself dry with a CLEAN TOWEL.  10. Wear CLEAN PAJAMAS to bed the night before surgery  11. Place CLEAN SHEETS on your bed the night of your first shower and DO NOT SLEEP WITH PETS.   Day of Surgery: Wear Clean/Comfortable clothing the morning of surgery Do not apply any  deodorants/lotions.   Remember to brush your teeth WITH YOUR REGULAR TOOTHPASTE.   Please read over the following fact sheets that you were given.

## 2019-11-04 ENCOUNTER — Encounter (HOSPITAL_COMMUNITY): Payer: Self-pay

## 2019-11-04 ENCOUNTER — Encounter (HOSPITAL_COMMUNITY)
Admission: RE | Admit: 2019-11-04 | Discharge: 2019-11-04 | Disposition: A | Discharge status: Home / Self Care | Payer: BC Managed Care – PPO | Source: Ambulatory Visit | Attending: Surgery | Admitting: Surgery

## 2019-11-04 ENCOUNTER — Other Ambulatory Visit: Payer: Self-pay

## 2019-11-04 DIAGNOSIS — Z01812 Encounter for preprocedural laboratory examination: Secondary | ICD-10-CM | POA: Insufficient documentation

## 2019-11-04 HISTORY — DX: Other specified postprocedural states: R11.2

## 2019-11-04 HISTORY — DX: Other specified postprocedural states: Z98.890

## 2019-11-04 LAB — COMPREHENSIVE METABOLIC PANEL
ALT: 34 U/L (ref 0–44)
AST: 27 U/L (ref 15–41)
Albumin: 4.1 g/dL (ref 3.5–5.0)
Alkaline Phosphatase: 60 U/L (ref 38–126)
Anion gap: 9 (ref 5–15)
BUN: 11 mg/dL (ref 6–20)
CO2: 27 mmol/L (ref 22–32)
Calcium: 9.6 mg/dL (ref 8.9–10.3)
Chloride: 104 mmol/L (ref 98–111)
Creatinine, Ser: 0.82 mg/dL (ref 0.61–1.24)
GFR calc Af Amer: >60 mL/min (ref >60)
GFR calc non Af Amer: >60 mL/min (ref >60)
Glucose, Bld: 104 mg/dL — ABNORMAL HIGH (ref 70–99)
Potassium: 4.1 mmol/L (ref 3.5–5.1)
Sodium: 140 mmol/L (ref 135–145)
Total Bilirubin: 1.2 mg/dL (ref 0.3–1.2)
Total Protein: 6.9 g/dL (ref 6.5–8.1)

## 2019-11-04 LAB — CBC
HCT: 46.2 % (ref 39.0–52.0)
Hemoglobin: 14.8 g/dL (ref 13.0–17.0)
MCH: 29.6 pg (ref 26.0–34.0)
MCHC: 32 g/dL (ref 30.0–36.0)
MCV: 92.4 fL (ref 80.0–100.0)
Platelets: 293 10*3/uL (ref 150–400)
RBC: 5 MIL/uL (ref 4.22–5.81)
RDW: 13.4 % (ref 11.5–15.5)
WBC: 8.3 10*3/uL (ref 4.0–10.5)
nRBC: 0 % (ref 0.0–0.2)

## 2019-11-04 NOTE — Progress Notes (Addendum)
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 Patient agrees to go over the instructions while at home for a better understanding. Patient also instructed to self quarantine after being tested for COVID-19. The opportunity to ask questions was provided.

## 2019-11-05 ENCOUNTER — Other Ambulatory Visit (HOSPITAL_COMMUNITY)
Admission: RE | Admit: 2019-11-05 | Discharge: 2019-11-05 | Disposition: A | Discharge status: Home / Self Care | Payer: BC Managed Care – PPO | Source: Ambulatory Visit | Attending: Surgery | Admitting: Surgery

## 2019-11-05 DIAGNOSIS — Z20822 Contact with and (suspected) exposure to covid-19: Secondary | ICD-10-CM | POA: Insufficient documentation

## 2019-11-05 DIAGNOSIS — Z01812 Encounter for preprocedural laboratory examination: Secondary | ICD-10-CM | POA: Diagnosis not present

## 2019-11-05 LAB — SARS CORONAVIRUS 2 (TAT 6-24 HRS): SARS Coronavirus 2: NEGATIVE

## 2019-11-09 ENCOUNTER — Ambulatory Visit (HOSPITAL_COMMUNITY): Payer: BC Managed Care – PPO | Admitting: Certified Registered"

## 2019-11-09 ENCOUNTER — Encounter (HOSPITAL_COMMUNITY)
Admission: RE | Disposition: A | Discharge status: Home / Self Care | Payer: Self-pay | Source: Home / Self Care | Attending: Surgery

## 2019-11-09 ENCOUNTER — Ambulatory Visit (HOSPITAL_COMMUNITY)
Admission: RE | Admit: 2019-11-09 | Discharge: 2019-11-09 | Disposition: A | Discharge status: Home / Self Care | Payer: BC Managed Care – PPO | Attending: Surgery | Admitting: Surgery

## 2019-11-09 ENCOUNTER — Encounter (HOSPITAL_COMMUNITY): Payer: Self-pay | Admitting: Surgery

## 2019-11-09 ENCOUNTER — Other Ambulatory Visit: Payer: Self-pay

## 2019-11-09 DIAGNOSIS — K4031 Unilateral inguinal hernia, with obstruction, without gangrene, recurrent: Secondary | ICD-10-CM | POA: Diagnosis not present

## 2019-11-09 DIAGNOSIS — Z87891 Personal history of nicotine dependence: Secondary | ICD-10-CM | POA: Diagnosis not present

## 2019-11-09 DIAGNOSIS — Z885 Allergy status to narcotic agent status: Secondary | ICD-10-CM | POA: Insufficient documentation

## 2019-11-09 DIAGNOSIS — K4091 Unilateral inguinal hernia, without obstruction or gangrene, recurrent: Secondary | ICD-10-CM | POA: Diagnosis not present

## 2019-11-09 DIAGNOSIS — K4030 Unilateral inguinal hernia, with obstruction, without gangrene, not specified as recurrent: Secondary | ICD-10-CM | POA: Diagnosis not present

## 2019-11-09 HISTORY — PX: INGUINAL HERNIA REPAIR: SHX194

## 2019-11-09 SURGERY — REPAIR, HERNIA, INGUINAL, LAPAROSCOPIC
Anesthesia: General | Site: Groin | Laterality: Left

## 2019-11-09 MED ORDER — ACETAMINOPHEN 10 MG/ML IV SOLN: 1000.0000 mg | Freq: Once | INTRAVENOUS | Status: DC | PRN

## 2019-11-09 MED ORDER — CHLORHEXIDINE GLUCONATE CLOTH 2 % EX PADS: 6.0000 | MEDICATED_PAD | Freq: Once | CUTANEOUS | Status: DC

## 2019-11-09 MED ORDER — OXYCODONE HCL 5 MG PO TABS
5.0000 mg | ORAL_TABLET | Freq: Four times a day (QID) | ORAL | 0 refills | Status: DC | PRN
Start: 2019-11-09 — End: 2019-12-17

## 2019-11-09 MED ORDER — ACETAMINOPHEN 10 MG/ML IV SOLN
INTRAVENOUS | Status: DC | PRN
  Administered 2019-11-09: 1000 mg via INTRAVENOUS

## 2019-11-09 MED ORDER — OXYCODONE HCL 5 MG PO TABS
ORAL_TABLET | ORAL | Status: AC
  Filled 2019-11-09: qty 1

## 2019-11-09 MED ORDER — ACETAMINOPHEN 160 MG/5ML PO SOLN: 1000.0000 mg | Freq: Once | ORAL | Status: DC | PRN

## 2019-11-09 MED ORDER — LIDOCAINE 2% (20 MG/ML) 5 ML SYRINGE
INTRAMUSCULAR | Status: AC
  Filled 2019-11-09: qty 5

## 2019-11-09 MED ORDER — ACETAMINOPHEN 500 MG PO TABS: 1000.0000 mg | ORAL_TABLET | Freq: Once | ORAL | Status: DC | PRN

## 2019-11-09 MED ORDER — HEPARIN SODIUM (PORCINE) 5000 UNIT/ML IJ SOLN
5000.0000 [IU] | Freq: Once | INTRAMUSCULAR | Status: AC
  Administered 2019-11-09: 5000 [IU] via SUBCUTANEOUS
  Filled 2019-11-09: qty 1

## 2019-11-09 MED ORDER — LACTATED RINGERS IV SOLN: INTRAVENOUS | Status: DC

## 2019-11-09 MED ORDER — PROPOFOL 10 MG/ML IV BOLUS
INTRAVENOUS | Status: DC | PRN
  Administered 2019-11-09: 160 mg via INTRAVENOUS

## 2019-11-09 MED ORDER — BUPIVACAINE HCL (PF) 0.25 % IJ SOLN
INTRAMUSCULAR | Status: AC
  Filled 2019-11-09: qty 30

## 2019-11-09 MED ORDER — ACETAMINOPHEN 10 MG/ML IV SOLN
INTRAVENOUS | Status: AC
  Filled 2019-11-09: qty 100

## 2019-11-09 MED ORDER — OXYCODONE HCL 5 MG/5ML PO SOLN: 5.0000 mg | Freq: Once | ORAL | Status: AC | PRN

## 2019-11-09 MED ORDER — ORAL CARE MOUTH RINSE: 15.0000 mL | Freq: Once | OROMUCOSAL | Status: AC

## 2019-11-09 MED ORDER — PROPOFOL 10 MG/ML IV BOLUS
INTRAVENOUS | Status: AC
  Filled 2019-11-09: qty 20

## 2019-11-09 MED ORDER — OXYCODONE HCL 5 MG PO TABS
5.0000 mg | ORAL_TABLET | Freq: Once | ORAL | Status: AC | PRN
  Administered 2019-11-09: 5 mg via ORAL

## 2019-11-09 MED ORDER — LACTATED RINGERS IV SOLN: INTRAVENOUS | Status: DC | PRN

## 2019-11-09 MED ORDER — CEFAZOLIN SODIUM-DEXTROSE 2-4 GM/100ML-% IV SOLN
2.0000 g | INTRAVENOUS | Status: AC
  Administered 2019-11-09: 2 g via INTRAVENOUS
  Filled 2019-11-09: qty 100

## 2019-11-09 MED ORDER — FENTANYL CITRATE (PF) 100 MCG/2ML IJ SOLN
INTRAMUSCULAR | Status: DC | PRN
Start: 2019-11-09 — End: 2019-11-09
  Administered 2019-11-09 (×5): 50 ug via INTRAVENOUS
  Administered 2019-11-09: 100 ug via INTRAVENOUS
  Administered 2019-11-09: 50 ug via INTRAVENOUS

## 2019-11-09 MED ORDER — SODIUM CHLORIDE 0.9 % IR SOLN
Status: DC | PRN
  Administered 2019-11-09: 1000 mL

## 2019-11-09 MED ORDER — PHENYLEPHRINE 40 MCG/ML (10ML) SYRINGE FOR IV PUSH (FOR BLOOD PRESSURE SUPPORT)
PREFILLED_SYRINGE | INTRAVENOUS | Status: DC | PRN
  Administered 2019-11-09: 40 ug via INTRAVENOUS
  Administered 2019-11-09: 80 ug via INTRAVENOUS

## 2019-11-09 MED ORDER — 0.9 % SODIUM CHLORIDE (POUR BTL) OPTIME
TOPICAL | Status: DC | PRN
  Administered 2019-11-09: 1000 mL

## 2019-11-09 MED ORDER — ROCURONIUM BROMIDE 10 MG/ML (PF) SYRINGE
PREFILLED_SYRINGE | INTRAVENOUS | Status: DC | PRN
  Administered 2019-11-09 (×2): 10 mg via INTRAVENOUS
  Administered 2019-11-09: 70 mg via INTRAVENOUS
  Administered 2019-11-09: 10 mg via INTRAVENOUS

## 2019-11-09 MED ORDER — LIDOCAINE 2% (20 MG/ML) 5 ML SYRINGE
INTRAMUSCULAR | Status: DC | PRN
  Administered 2019-11-09: 40 mg via INTRAVENOUS

## 2019-11-09 MED ORDER — ONDANSETRON HCL 4 MG/2ML IJ SOLN
INTRAMUSCULAR | Status: DC | PRN
  Administered 2019-11-09: 4 mg via INTRAVENOUS

## 2019-11-09 MED ORDER — MIDAZOLAM HCL 5 MG/5ML IJ SOLN
INTRAMUSCULAR | Status: DC | PRN
  Administered 2019-11-09: 2 mg via INTRAVENOUS

## 2019-11-09 MED ORDER — DOCUSATE SODIUM 100 MG PO CAPS: 100.0000 mg | ORAL_CAPSULE | Freq: Two times a day (BID) | ORAL | 2 refills | Status: DC

## 2019-11-09 MED ORDER — FENTANYL CITRATE (PF) 100 MCG/2ML IJ SOLN: 25.0000 ug | INTRAMUSCULAR | Status: DC | PRN

## 2019-11-09 MED ORDER — MIDAZOLAM HCL 2 MG/2ML IJ SOLN
INTRAMUSCULAR | Status: AC
  Filled 2019-11-09: qty 2

## 2019-11-09 MED ORDER — CHLORHEXIDINE GLUCONATE 0.12 % MT SOLN
15.0000 mL | Freq: Once | OROMUCOSAL | Status: AC
  Administered 2019-11-09: 15 mL via OROMUCOSAL
  Filled 2019-11-09: qty 15

## 2019-11-09 MED ORDER — DEXAMETHASONE SODIUM PHOSPHATE 10 MG/ML IJ SOLN
INTRAMUSCULAR | Status: AC
  Filled 2019-11-09: qty 1

## 2019-11-09 MED ORDER — FENTANYL CITRATE (PF) 250 MCG/5ML IJ SOLN
INTRAMUSCULAR | Status: AC
  Filled 2019-11-09: qty 5

## 2019-11-09 MED ORDER — KETOROLAC TROMETHAMINE 30 MG/ML IJ SOLN
INTRAMUSCULAR | Status: AC
  Filled 2019-11-09: qty 1

## 2019-11-09 MED ORDER — IBUPROFEN 800 MG PO TABS: 800.0000 mg | ORAL_TABLET | Freq: Three times a day (TID) | ORAL | 0 refills | Status: DC | PRN

## 2019-11-09 MED ORDER — ONDANSETRON HCL 4 MG/2ML IJ SOLN
INTRAMUSCULAR | Status: AC
  Filled 2019-11-09: qty 2

## 2019-11-09 MED ORDER — DEXAMETHASONE SODIUM PHOSPHATE 10 MG/ML IJ SOLN
INTRAMUSCULAR | Status: DC | PRN
  Administered 2019-11-09: 10 mg via INTRAVENOUS

## 2019-11-09 MED ORDER — ROCURONIUM BROMIDE 10 MG/ML (PF) SYRINGE
PREFILLED_SYRINGE | INTRAVENOUS | Status: AC
  Filled 2019-11-09: qty 10

## 2019-11-09 MED ORDER — KETOROLAC TROMETHAMINE 30 MG/ML IJ SOLN
INTRAMUSCULAR | Status: DC | PRN
  Administered 2019-11-09 (×2): 15 mg via INTRAVENOUS

## 2019-11-09 MED ORDER — BUPIVACAINE HCL 0.25 % IJ SOLN
INTRAMUSCULAR | Status: DC | PRN
  Administered 2019-11-09: 12 mL

## 2019-11-09 SURGICAL SUPPLY — 38 items
APPLIER CLIP LOGIC TI 5 (MISCELLANEOUS) IMPLANT
CANISTER SUCT 3000ML PPV (MISCELLANEOUS) IMPLANT
COVER SURGICAL LIGHT HANDLE (MISCELLANEOUS) ×2 IMPLANT
COVER WAND RF STERILE (DRAPES) IMPLANT
DEVICE SECURE STRAP 25 ABSORB (INSTRUMENTS) ×2 IMPLANT
DISSECT BALLN SPACEMKR + OVL (BALLOONS) ×2
DISSECTOR BALLN SPACEMKR + OVL (BALLOONS) ×1 IMPLANT
DISSECTOR BLUNT TIP ENDO 5MM (MISCELLANEOUS) IMPLANT
ELECT REM PT RETURN 9FT ADLT (ELECTROSURGICAL) ×2
ELECTRODE REM PT RTRN 9FT ADLT (ELECTROSURGICAL) ×1 IMPLANT
ENDOLOOP SUT PDS II  0 18 (SUTURE) ×8
ENDOLOOP SUT PDS II 0 18 (SUTURE) ×4 IMPLANT
GLOVE BIO SURGEON STRL SZ 6.5 (GLOVE) ×2 IMPLANT
GLOVE BIOGEL PI IND STRL 6 (GLOVE) ×1 IMPLANT
GLOVE BIOGEL PI INDICATOR 6 (GLOVE) ×1
GOWN STRL REUS W/ TWL LRG LVL3 (GOWN DISPOSABLE) ×2 IMPLANT
GOWN STRL REUS W/TWL LRG LVL3 (GOWN DISPOSABLE) ×4
KIT BASIN OR (CUSTOM PROCEDURE TRAY) ×2 IMPLANT
KIT TURNOVER KIT B (KITS) ×2 IMPLANT
MESH 3DMAX LIGHT 4.1X6.2 LT LR (Mesh General) ×2 IMPLANT
NEEDLE INSUFFLATION 14GA 120MM (NEEDLE) IMPLANT
NS IRRIG 1000ML POUR BTL (IV SOLUTION) ×2 IMPLANT
PAD ARMBOARD 7.5X6 YLW CONV (MISCELLANEOUS) ×4 IMPLANT
SCISSORS LAP 5X35 DISP (ENDOMECHANICALS) IMPLANT
SET IRRIG TUBING LAPAROSCOPIC (IRRIGATION / IRRIGATOR) IMPLANT
SET TROCAR LAP APPLE-HUNT 5MM (ENDOMECHANICALS) IMPLANT
SET TUBE SMOKE EVAC HIGH FLOW (TUBING) ×2 IMPLANT
SLEEVE ENDOPATH XCEL 5M (ENDOMECHANICALS) IMPLANT
SUT MNCRL AB 4-0 PS2 18 (SUTURE) ×2 IMPLANT
TOWEL GREEN STERILE (TOWEL DISPOSABLE) ×2 IMPLANT
TOWEL GREEN STERILE FF (TOWEL DISPOSABLE) IMPLANT
TRAY FOLEY W/BAG SLVR 16FR (SET/KITS/TRAYS/PACK)
TRAY FOLEY W/BAG SLVR 16FR ST (SET/KITS/TRAYS/PACK) IMPLANT
TRAY LAPAROSCOPIC MC (CUSTOM PROCEDURE TRAY) ×2 IMPLANT
TROCAR OPTICAL SHORT 5MM (TROCAR) ×2 IMPLANT
TROCAR OPTICAL SLV SHORT 5MM (TROCAR) ×2 IMPLANT
TROCAR XCEL NON-BLD 5MMX100MML (ENDOMECHANICALS) IMPLANT
WATER STERILE IRR 1000ML POUR (IV SOLUTION) ×2 IMPLANT

## 2019-11-09 NOTE — Anesthesia Preprocedure Evaluation (Addendum)
Anesthesia Evaluation  Patient identified by MRN, date of birth, ID band Patient awake    Reviewed: Allergy & Precautions, NPO status , Patient's Chart, lab work & pertinent test results  History of Anesthesia Complications (+) PONV and history of anesthetic complications  Airway Mallampati: I  TM Distance: >3 FB Neck ROM: Full    Dental  (+) Dental Advisory Given, Teeth Intact   Pulmonary neg recent URI, former smoker,    breath sounds clear to auscultation       Cardiovascular negative cardio ROS   Rhythm:Regular     Neuro/Psych negative neurological ROS  negative psych ROS   GI/Hepatic negative GI ROS, Neg liver ROS,   Endo/Other  negative endocrine ROS  Renal/GU negative Renal ROS     Musculoskeletal negative musculoskeletal ROS (+)   Abdominal   Peds  Hematology negative hematology ROS (+)   Anesthesia Other Findings   Reproductive/Obstetrics                             Anesthesia Physical Anesthesia Plan  ASA: I  Anesthesia Plan: General   Post-op Pain Management:    Induction: Intravenous  PONV Risk Score and Plan: 3 and Ondansetron, Dexamethasone and Midazolam  Airway Management Planned: Oral ETT  Additional Equipment: None  Intra-op Plan:   Post-operative Plan: Extubation in OR  Informed Consent: I have reviewed the patients History and Physical, chart, labs and discussed the procedure including the risks, benefits and alternatives for the proposed anesthesia with the patient or authorized representative who has indicated his/her understanding and acceptance.     Dental advisory given  Plan Discussed with: CRNA and Surgeon  Anesthesia Plan Comments:        Anesthesia Quick Evaluation

## 2019-11-09 NOTE — Op Note (Signed)
   Operative Note   Date: 11/09/2019  Procedure: laparoscopic left inguinal hernia repair with mesh  Pre-op diagnosis: left inguinal hernia Post-op diagnosis: incarcerated, indirect, left inguinal hernia  Indication and clinical history: The patient is a 59 y.o. year old male with a symptomatic, recurrent, fat-containing left inguinal hernia     Surgeon: Jesusita Oka, MD  Anesthesiologist: Ermalene Postin, MD Anesthesia: General  Findings:  . Specimen: none . EBL: 15cc . Drains/Implants: large, left, 3D max inguinal mesh  Disposition: PACU - hemodynamically stable.  Description of procedure: The patient was positioned supine on the operating room table. General anesthetic induction and intubation were uneventful. Foley catheter insertion was performed and was atraumatic. Time-out was performed verifying correct patient, procedure, signature of informed consent, and administration of pre-operative antibiotics.   The abdomen was prepped and draped in the usual sterile fashion. An infraumbilical incision was made and deepened through the anterior layer of the fascia. A balloon tipped trocar was inserted in the retrorectus space and advanced caudad until the pubic tubercle was reached. The balloon was inflated under direct visualization to develop the pre-peritoneal space. Two additional 45mm trocars were inserted under direct visualization in the suprapubic region and halfway between the first and second trocars. The hernia was confirmed to be in an indirect location. Blunt dissection was performed to further develop this space and to strip the hernia sac off the spermatic cord. During dissection, the hernia sac was entered and through this defect, the intra-peritoneal fat was able to be reduced. A Veress needle was placed in the left upper quadrant to evacuate the intra-peritoneal air. After reduction, the defect in the hernia sac was closed with three endoloop sutures. A 3D Max large, left-sided  mesh was inserted into the pre-peritoneal space and positioned to lay over the potential sites of direct, indirect, and femoral hernia sites. The mesh was flat and was tacked anteriorly using an absorbable tacker. The pre-peritoneal space was desufflated under direct visualization and the mesh visualized to remain flat. The trocars were removed after complete evacuation of air. The fascia was closed with zero vicryl suture and the skin of all incisions closed with 4-0 monocryl.   Sterile dressings were applied using dermabond. All sponge and instrument counts were correct at the conclusion of the procedure. The patient was awakened from anesthesia, extubated uneventfully, and transported to the PACU in good condition. There were no complications.   Clinical update provided to wife post-op via phone.    Jesusita Oka, MD General and [facility] [place] [facility]

## 2019-11-09 NOTE — Transfer of Care (Signed)
Immediate Anesthesia Transfer of Care Note  Patient: Ian Clark  Procedure(s) Performed: LAPAROSCOPIC LEFT INGUINAL HERNIA WITH MESH (Left Groin)  Patient Location: PACU  Anesthesia Type:General  Level of Consciousness: awake, oriented and patient cooperative  Airway & Oxygen Therapy: Patient Spontanous Breathing and Patient connected to nasal cannula oxygen  Post-op Assessment: Report given to RN and Post -op Vital signs reviewed and stable  Post vital signs: Reviewed  Last Vitals:  Vitals Value Taken Time  BP 129/89 11/09/19 1221  Temp    Pulse 77 11/09/19 1227  Resp 13 11/09/19 1227  SpO2 94 % 11/09/19 1227  Vitals shown include unvalidated device data.  Last Pain:  Vitals:   11/09/19 0553  TempSrc: Oral         Complications: No complications documented.

## 2019-11-09 NOTE — Anesthesia Procedure Notes (Signed)
Procedure Name: Intubation Date/Time: 11/09/2019 9:49 AM Performed by: Jenne Campus, CRNA Pre-anesthesia Checklist: Patient identified, Emergency Drugs available, Suction available and Patient being monitored Patient Re-evaluated:Patient Re-evaluated prior to induction Oxygen Delivery Method: Circle System Utilized Preoxygenation: Pre-oxygenation with 100% oxygen Induction Type: IV induction Ventilation: Mask ventilation without difficulty and Oral airway inserted - appropriate to patient size Laryngoscope Size: Sabra Heck and 3 Grade View: Grade II Tube type: Oral Tube size: 7.5 mm Number of attempts: 1 Airway Equipment and Method: Stylet and Oral airway Placement Confirmation: ETT inserted through vocal cords under direct vision,  positive ETCO2 and breath sounds checked- equal and bilateral Secured at: 21 cm Tube secured with: Tape Dental Injury: Teeth and Oropharynx as per pre-operative assessment

## 2019-11-09 NOTE — H&P (Signed)
    Reason for Consult/Chief Complaint: left inguinal hernia, recurrent  Ian Clark is an 59 y.o. male.   HPI: 15M with symptomatic, fat containing, recurrent LIH, presents for repair. The patient has had no hospitalizations, doctors visits, ER visits, surgeries, or newly diagnosed allergies since being seen in clinic.    Past Medical History:  Diagnosis Date  . PONV (postoperative nausea and vomiting)     Past Surgical History:  Procedure Laterality Date  . COLONOSCOPY    . EYE SURGERY Left    laser retinal surgery  . HERNIA REPAIR     x2  . RESECTION DISTAL CLAVICAL     metal there  . SHOULDER SURGERY Left   . TONSILLECTOMY    . Ulnar nerve surgery    . WISDOM TOOTH EXTRACTION      History reviewed. No pertinent family history.  Social History:  reports that he has quit smoking. He has never used smokeless tobacco. He reports current alcohol use of about 4.0 standard drinks of alcohol per week. He reports that he does not use drugs.  Allergies:  Allergies  Allergen Reactions  . Morphine And Related     Bp dropped     Medications: I have reviewed the patient's current medications.  No results found for this or any previous visit (from the past 48 hour(s)).  No results found.  ROS 10 point review of systems is negative except as listed above in HPI.   Physical Exam Blood pressure (!) 142/99, pulse 66, temperature 98.1 F (36.7 C), temperature source Oral, resp. rate 18, SpO2 99 %. Constitutional: well-developed, well-nourished HEENT: pupils equal, round, reactive to light, 58mm b/l, moist conjunctiva, external inspection of ears and nose normal, hearing intact Oropharynx: normal oropharyngeal mucosa, normal dentition Neck: no thyromegaly, trachea midline, no midline cervical tenderness to palpation Chest: breath sounds equal bilaterally, normal respiratory effort, no midline or lateral chest wall tenderness to palpation/deformity Abdomen: soft, NT, no  bruising, no hepatosplenomegaly GU: no blood at urethral meatus of penis, no scrotal masses or abnormality, LIH Back: no wounds, no thoracic/lumbar spine tenderness to palpation, no thoracic/lumbar spine stepoffs Rectal: deferred Extremities: 2+ radial and pedal pulses bilaterally, motor and sensation intact to bilateral UE and LE, no peripheral edema MSK: normal gait/station, no clubbing/cyanosis of fingers/toes, normal ROM of all four extremities Skin: warm, dry, no rashes Psych: normal memory, normal mood/affect    Assessment/Plan: 15M with symptomatic, recurrent LIH. Informed consent was obtained after detailed explanation of risks, including bleeding, infection, hematoma/seroma, mesh infection requiring explantation, and need for conversion to open procedure. All questions answered to the patient's satisfaction.  Jesusita Oka, MD General and [facility]

## 2019-11-10 ENCOUNTER — Encounter (HOSPITAL_COMMUNITY): Payer: Self-pay | Admitting: Surgery

## 2019-11-10 NOTE — Anesthesia Postprocedure Evaluation (Signed)
Anesthesia Post Note  Patient: Ian Clark  Procedure(s) Performed: LAPAROSCOPIC LEFT INGUINAL HERNIA WITH MESH (Left Groin)     Patient location during evaluation: PACU Anesthesia Type: General Level of consciousness: awake and alert Pain management: pain level controlled Vital Signs Assessment: post-procedure vital signs reviewed and stable Respiratory status: spontaneous breathing, nonlabored ventilation, respiratory function stable and patient connected to nasal cannula oxygen Cardiovascular status: blood pressure returned to baseline and stable Postop Assessment: no apparent nausea or vomiting Anesthetic complications: no   No complications documented.  Last Vitals:  Vitals:   11/09/19 1250 11/09/19 1300  BP: 107/74   Pulse: 70 66  Resp: 13 12  Temp:    SpO2: 95% 96%    Last Pain:  Vitals:   11/09/19 1220  TempSrc:   PainSc: 5                  Maikol Grassia

## 2019-12-15 DIAGNOSIS — Z885 Allergy status to narcotic agent status: Secondary | ICD-10-CM | POA: Diagnosis not present

## 2019-12-15 DIAGNOSIS — R9431 Abnormal electrocardiogram [ECG] [EKG]: Secondary | ICD-10-CM | POA: Diagnosis not present

## 2019-12-15 DIAGNOSIS — I498 Other specified cardiac arrhythmias: Secondary | ICD-10-CM | POA: Diagnosis not present

## 2019-12-15 DIAGNOSIS — K219 Gastro-esophageal reflux disease without esophagitis: Secondary | ICD-10-CM | POA: Diagnosis not present

## 2019-12-15 DIAGNOSIS — R0789 Other chest pain: Secondary | ICD-10-CM | POA: Diagnosis not present

## 2019-12-15 DIAGNOSIS — Z136 Encounter for screening for cardiovascular disorders: Secondary | ICD-10-CM | POA: Diagnosis not present

## 2019-12-15 DIAGNOSIS — R079 Chest pain, unspecified: Secondary | ICD-10-CM | POA: Diagnosis not present

## 2019-12-15 DIAGNOSIS — M79602 Pain in left arm: Secondary | ICD-10-CM | POA: Diagnosis not present

## 2019-12-15 DIAGNOSIS — Z79899 Other long term (current) drug therapy: Secondary | ICD-10-CM | POA: Diagnosis not present

## 2019-12-17 ENCOUNTER — Encounter: Payer: Self-pay | Admitting: Cardiovascular Disease

## 2019-12-17 ENCOUNTER — Ambulatory Visit (INDEPENDENT_AMBULATORY_CARE_PROVIDER_SITE_OTHER): Payer: BC Managed Care – PPO | Admitting: Cardiovascular Disease

## 2019-12-17 ENCOUNTER — Other Ambulatory Visit: Payer: Self-pay

## 2019-12-17 VITALS — BP 142/82 | HR 71 | Ht 71.0 in | Wt 202.0 lb

## 2019-12-17 DIAGNOSIS — R079 Chest pain, unspecified: Secondary | ICD-10-CM

## 2019-12-17 DIAGNOSIS — R0789 Other chest pain: Secondary | ICD-10-CM

## 2019-12-17 MED ORDER — ASPIRIN EC 81 MG PO TBEC: 81.0000 mg | DELAYED_RELEASE_TABLET | Freq: Every day | ORAL | 3 refills | Status: AC

## 2019-12-17 MED ORDER — ISOSORBIDE MONONITRATE ER 30 MG PO TB24: 15.0000 mg | ORAL_TABLET | Freq: Every day | ORAL | 3 refills | Status: DC

## 2019-12-17 NOTE — Addendum Note (Signed)
Addended by: Rexanne Mano B on: 12/17/2019 02:08 PM   Modules accepted: Orders
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
## 2019-12-17 NOTE — Patient Instructions (Signed)
Medication Instructions:  START IMDUR (15mg  Daily) START ASPIRIN (81mg  Daily) *If you need a refill on your cardiac medications before your next appointment, please call your pharmacy*  Lab Work: BMET, LIPID, LIVER- Today  If you have labs (blood work) drawn today and your tests are completely normal, you will receive your results only by:  [place] (if you have MyChart) OR  A paper copy in the mail If you have any lab test that is abnormal or we need to change your treatment, we will call you to review the results.  Testing/Procedures:    [place] [address] [place] [address] Dept: [phone] Loc: [place]  12/17/2019  You are scheduled for a Cardiac Catheterization on Monday, October 4 with Dr. Quay Burow.  1. Please arrive at the [facility] (Main Entrance A) at [facility]: [address] [place], [place] [address] at 9:30 AM (This time is two hours before your procedure to ensure your preparation). Free valet parking service is available.   Special note: Every effort is made to have your procedure done on time. Please understand that emergencies sometimes delay scheduled procedures.  2. Diet: Do not eat solid foods after midnight.  The patient may have clear liquids until 5am upon the day of the procedure.  3. Labs: You will need to have blood drawn on Thursday, September 30 at [facility]  Open: 8am - 5pm (Lunch 12:30 - 1:30)   Phone: [phone]. You do not need to be fasting.  4. Medication instructions in preparation for your procedure:   Contrast Allergy: No  On the morning of your procedure, take your Aspirin and any morning medicines NOT listed above.  You may use sips of water.  5. Plan for one night stay--bring personal belongings. 6. Bring a current list of your medications and current  insurance cards. 7. You MUST have a responsible person to drive you home. 8. Someone MUST be with you the first 24 hours after you arrive home or your discharge will be delayed. 9. Please wear clothes that are easy to get on and off and wear slip-on shoes.  Thank you for allowing [place] to care for you!   -- [facility]  Follow-Up: At [facility], you and your health needs are our priority.  As part of our continuing mission to provide you with exceptional heart care, we have created designated Provider Care Teams.  These Care Teams include your primary Cardiologist (physician) and Advanced Practice Providers (APPs -  Physician Assistants and Nurse Practitioners) who all work together to provide you with the care you need, when you need it.  We recommend signing up for the patient portal called "MyChart".  Sign up information is provided on this After Visit Summary.  MyChart is used to connect with patients for Virtual Visits (Telemedicine).  Patients are able to view lab/test results, encounter notes, upcoming appointments, etc.  Non-urgent messages can be sent to your provider as well.   To learn more about what you can do with MyChart, go to [email or web address].    Your next appointment:   2 week(s) after cath   The format for your next appointment:   In Person  Provider:   Quay Burow, MD
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
## 2019-12-18 ENCOUNTER — Other Ambulatory Visit: Payer: Self-pay | Admitting: *Deleted

## 2019-12-18 ENCOUNTER — Other Ambulatory Visit (HOSPITAL_COMMUNITY)
Admission: RE | Admit: 2019-12-18 | Discharge: 2019-12-18 | Disposition: A | Discharge status: Home / Self Care | Payer: BC Managed Care – PPO | Source: Ambulatory Visit | Attending: Cardiovascular Disease | Admitting: Cardiovascular Disease

## 2019-12-18 DIAGNOSIS — Z20822 Contact with and (suspected) exposure to covid-19: Secondary | ICD-10-CM | POA: Insufficient documentation

## 2019-12-18 DIAGNOSIS — Z01812 Encounter for preprocedural laboratory examination: Secondary | ICD-10-CM | POA: Diagnosis not present

## 2019-12-18 DIAGNOSIS — R079 Chest pain, unspecified: Secondary | ICD-10-CM

## 2019-12-18 LAB — CBC WITH DIFFERENTIAL/PLATELET
Basophils Absolute: 0.1 10*3/uL (ref 0.0–0.2)
Basos: 1 %
EOS (ABSOLUTE): 0.2 10*3/uL (ref 0.0–0.4)
Eos: 2 %
Hematocrit: 43.6 % (ref 37.5–51.0)
Hemoglobin: 14.9 g/dL (ref 13.0–17.7)
Immature Grans (Abs): 0.1 10*3/uL (ref 0.0–0.1)
Immature Granulocytes: 1 %
Lymphocytes Absolute: 1.9 10*3/uL (ref 0.7–3.1)
Lymphs: 24 %
MCH: 30.9 pg (ref 26.6–33.0)
MCHC: 34.2 g/dL (ref 31.5–35.7)
MCV: 91 fL (ref 79–97)
Monocytes Absolute: 0.6 10*3/uL (ref 0.1–0.9)
Monocytes: 8 %
Neutrophils Absolute: 5.2 10*3/uL (ref 1.4–7.0)
Neutrophils: 64 %
Platelets: 304 10*3/uL (ref 150–450)
RBC: 4.82 x10E6/uL (ref 4.14–5.80)
RDW: 12.4 % (ref 11.6–15.4)
WBC: 8 10*3/uL (ref 3.4–10.8)

## 2019-12-18 LAB — BASIC METABOLIC PANEL
BUN/Creatinine Ratio: 13 (ref 10–24)
BUN: 10 mg/dL (ref 8–27)
CO2: 24 mmol/L (ref 20–29)
Calcium: 9.5 mg/dL (ref 8.6–10.2)
Chloride: 105 mmol/L (ref 96–106)
Creatinine, Ser: 0.75 mg/dL — ABNORMAL LOW (ref 0.76–1.27)
GFR calc Af Amer: 115 mL/min/{1.73_m2} (ref >59)
GFR calc non Af Amer: 100 mL/min/{1.73_m2} (ref >59)
Glucose: 90 mg/dL (ref 65–99)
Potassium: 4.2 mmol/L (ref 3.5–5.2)
Sodium: 142 mmol/L (ref 134–144)

## 2019-12-18 LAB — LIPID PANEL
Chol/HDL Ratio: 3.1 ratio (ref 0.0–5.0)
Cholesterol, Total: 243 mg/dL — ABNORMAL HIGH (ref 100–199)
HDL: 79 mg/dL (ref >39)
LDL Chol Calc (NIH): 146 mg/dL — ABNORMAL HIGH (ref 0–99)
Triglycerides: 106 mg/dL (ref 0–149)
VLDL Cholesterol Cal: 18 mg/dL (ref 5–40)

## 2019-12-18 LAB — HEPATIC FUNCTION PANEL
ALT: 24 IU/L (ref 0–44)
AST: 23 IU/L (ref 0–40)
Albumin: 4.6 g/dL (ref 3.8–4.9)
Alkaline Phosphatase: 72 IU/L (ref 44–121)
Bilirubin Total: 0.4 mg/dL (ref 0.0–1.2)
Bilirubin, Direct: 0.13 mg/dL (ref 0.00–0.40)
Total Protein: 6.5 g/dL (ref 6.0–8.5)

## 2019-12-18 LAB — SARS CORONAVIRUS 2 (TAT 6-24 HRS): SARS Coronavirus 2: NEGATIVE

## 2019-12-18 MED ORDER — SODIUM CHLORIDE 0.9% FLUSH: 3.0000 mL | Freq: Two times a day (BID) | INTRAVENOUS | Status: DC

## 2019-12-21 ENCOUNTER — Other Ambulatory Visit: Payer: Self-pay

## 2019-12-21 ENCOUNTER — Encounter (HOSPITAL_COMMUNITY)
Admission: RE | Disposition: A | Discharge status: Home / Self Care | Payer: Self-pay | Source: Home / Self Care | Attending: Cardiovascular Disease

## 2019-12-21 ENCOUNTER — Encounter (HOSPITAL_COMMUNITY): Payer: Self-pay | Admitting: Cardiovascular Disease

## 2019-12-21 ENCOUNTER — Ambulatory Visit (HOSPITAL_COMMUNITY)
Admission: RE | Admit: 2019-12-21 | Discharge: 2019-12-21 | Disposition: A | Discharge status: Home / Self Care | Payer: BC Managed Care – PPO | Attending: Cardiovascular Disease | Admitting: Cardiovascular Disease

## 2019-12-21 DIAGNOSIS — R0789 Other chest pain: Secondary | ICD-10-CM | POA: Diagnosis present

## 2019-12-21 DIAGNOSIS — Z79899 Other long term (current) drug therapy: Secondary | ICD-10-CM | POA: Diagnosis not present

## 2019-12-21 DIAGNOSIS — Z87891 Personal history of nicotine dependence: Secondary | ICD-10-CM | POA: Insufficient documentation

## 2019-12-21 DIAGNOSIS — K219 Gastro-esophageal reflux disease without esophagitis: Secondary | ICD-10-CM | POA: Diagnosis not present

## 2019-12-21 DIAGNOSIS — Z885 Allergy status to narcotic agent status: Secondary | ICD-10-CM | POA: Insufficient documentation

## 2019-12-21 DIAGNOSIS — R079 Chest pain, unspecified: Secondary | ICD-10-CM

## 2019-12-21 DIAGNOSIS — K449 Diaphragmatic hernia without obstruction or gangrene: Secondary | ICD-10-CM | POA: Diagnosis not present

## 2019-12-21 HISTORY — PX: LEFT HEART CATH AND CORONARY ANGIOGRAPHY: CATH118249

## 2019-12-21 SURGERY — LEFT HEART CATH AND CORONARY ANGIOGRAPHY
Anesthesia: LOCAL

## 2019-12-21 MED ORDER — SODIUM CHLORIDE 0.9 % WEIGHT BASED INFUSION: 1.0000 mL/kg/h | INTRAVENOUS | Status: DC

## 2019-12-21 MED ORDER — SODIUM CHLORIDE 0.9% FLUSH: 3.0000 mL | Freq: Two times a day (BID) | INTRAVENOUS | Status: DC

## 2019-12-21 MED ORDER — MIDAZOLAM HCL 2 MG/2ML IJ SOLN
INTRAMUSCULAR | Status: AC
  Filled 2019-12-21: qty 2

## 2019-12-21 MED ORDER — HEPARIN (PORCINE) IN NACL 1000-0.9 UT/500ML-% IV SOLN
INTRAVENOUS | Status: AC
  Filled 2019-12-21: qty 1000

## 2019-12-21 MED ORDER — NITROGLYCERIN 1 MG/10 ML FOR IR/CATH LAB
INTRA_ARTERIAL | Status: AC
  Filled 2019-12-21: qty 10

## 2019-12-21 MED ORDER — MIDAZOLAM HCL 2 MG/2ML IJ SOLN
INTRAMUSCULAR | Status: DC | PRN
  Administered 2019-12-21: 1 mg via INTRAVENOUS

## 2019-12-21 MED ORDER — SODIUM CHLORIDE 0.9 % IV SOLN: INTRAVENOUS | Status: AC

## 2019-12-21 MED ORDER — SODIUM CHLORIDE 0.9 % IV SOLN: 250.0000 mL | INTRAVENOUS | Status: DC | PRN

## 2019-12-21 MED ORDER — FENTANYL CITRATE (PF) 100 MCG/2ML IJ SOLN
INTRAMUSCULAR | Status: DC | PRN
Start: 2019-12-21 — End: 2019-12-21
  Administered 2019-12-21: 25 ug via INTRAVENOUS

## 2019-12-21 MED ORDER — ACETAMINOPHEN 325 MG PO TABS: 650.0000 mg | ORAL_TABLET | ORAL | Status: DC | PRN

## 2019-12-21 MED ORDER — IODIXANOL 320 MG/ML IV SOLN
INTRAVENOUS | Status: DC | PRN
  Administered 2019-12-21: 45 mL via INTRA_ARTERIAL

## 2019-12-21 MED ORDER — VERAPAMIL HCL 2.5 MG/ML IV SOLN
INTRAVENOUS | Status: AC
  Filled 2019-12-21: qty 2

## 2019-12-21 MED ORDER — ONDANSETRON HCL 4 MG/2ML IJ SOLN: 4.0000 mg | Freq: Four times a day (QID) | INTRAMUSCULAR | Status: DC | PRN

## 2019-12-21 MED ORDER — HEPARIN (PORCINE) IN NACL 1000-0.9 UT/500ML-% IV SOLN
INTRAVENOUS | Status: DC | PRN
  Administered 2019-12-21 (×2): 500 mL

## 2019-12-21 MED ORDER — LABETALOL HCL 5 MG/ML IV SOLN: 10.0000 mg | INTRAVENOUS | Status: DC | PRN

## 2019-12-21 MED ORDER — LIDOCAINE HCL (PF) 1 % IJ SOLN
INTRAMUSCULAR | Status: DC | PRN
  Administered 2019-12-21: 2 mL

## 2019-12-21 MED ORDER — LIDOCAINE HCL (PF) 1 % IJ SOLN
INTRAMUSCULAR | Status: AC
  Filled 2019-12-21: qty 30

## 2019-12-21 MED ORDER — SODIUM CHLORIDE 0.9% FLUSH: 3.0000 mL | INTRAVENOUS | Status: DC | PRN

## 2019-12-21 MED ORDER — HEPARIN SODIUM (PORCINE) 1000 UNIT/ML IJ SOLN
INTRAMUSCULAR | Status: DC | PRN
  Administered 2019-12-21: 4000 [IU] via INTRAVENOUS

## 2019-12-21 MED ORDER — HYDRALAZINE HCL 20 MG/ML IJ SOLN: 10.0000 mg | INTRAMUSCULAR | Status: DC | PRN

## 2019-12-21 MED ORDER — IOHEXOL 350 MG/ML SOLN
INTRAVENOUS | Status: AC
  Filled 2019-12-21: qty 1

## 2019-12-21 MED ORDER — FENTANYL CITRATE (PF) 100 MCG/2ML IJ SOLN
INTRAMUSCULAR | Status: AC
  Filled 2019-12-21: qty 2

## 2019-12-21 MED ORDER — VERAPAMIL HCL 2.5 MG/ML IV SOLN
INTRA_ARTERIAL | Status: DC | PRN
  Administered 2019-12-21: 5 mL via INTRA_ARTERIAL

## 2019-12-21 MED ORDER — SODIUM CHLORIDE 0.9 % WEIGHT BASED INFUSION
3.0000 mL/kg/h | INTRAVENOUS | Status: AC
  Administered 2019-12-21: 3 mL/kg/h via INTRAVENOUS

## 2019-12-21 MED ORDER — ASPIRIN 81 MG PO CHEW: 81.0000 mg | CHEWABLE_TABLET | ORAL | Status: DC

## 2019-12-21 SURGICAL SUPPLY — 14 items
CATH INFINITI 5FR ANG PIGTAIL (CATHETERS) ×2 IMPLANT
CATH OPTITORQUE TIG 4.0 5F (CATHETERS) ×2 IMPLANT
DEVICE RAD COMP TR BAND LRG (VASCULAR PRODUCTS) ×2 IMPLANT
GLIDESHEATH SLEND A-KIT 6F 22G (SHEATH) ×2 IMPLANT
GUIDEWIRE INQWIRE 1.5J.035X260 (WIRE) ×1 IMPLANT
INQWIRE 1.5J .035X260CM (WIRE) ×2
KIT HEART LEFT (KITS) ×2 IMPLANT
PACK CARDIAC CATHETERIZATION (CUSTOM PROCEDURE TRAY) ×2 IMPLANT
SHEATH PINNACLE 5F 10CM (SHEATH) IMPLANT
SYR MEDRAD MARK 7 150ML (SYRINGE) ×2 IMPLANT
TRANSDUCER W/STOPCOCK (MISCELLANEOUS) ×2 IMPLANT
TUBING CIL FLEX 10 FLL-RA (TUBING) ×2 IMPLANT
WIRE EMERALD 3MM-J .035X150CM (WIRE) IMPLANT
WIRE HI TORQ VERSACORE-J 145CM (WIRE) ×2 IMPLANT

## 2019-12-21 NOTE — Interval H&P Note (Signed)
Cath Lab Visit (complete for each Cath Lab visit)  Clinical Evaluation Leading to the Procedure:   ACS: No.  Non-ACS:    Anginal Classification: CCS II  Anti-ischemic medical therapy: Minimal Therapy (1 class of medications)  Non-Invasive Test Results: No non-invasive testing performed  Prior CABG: No previous CABG      History and Physical Interval Note:  12/21/2019 9:26 AM  Ian Clark  has presented today for surgery, with the diagnosis of chest pain.  The various methods of treatment have been discussed with the patient and family. After consideration of risks, benefits and other options for treatment, the patient has consented to  Procedure(s): LEFT HEART CATH AND CORONARY ANGIOGRAPHY (N/A) as a surgical intervention.  The patient's history has been reviewed, patient examined, no change in status, stable for surgery.  I have reviewed the patient's chart and labs.  Questions were answered to the patient's satisfaction.     Quay Burow

## 2019-12-21 NOTE — Addendum Note (Signed)
Addended by: Rexanne Mano B on: 12/21/2019 08:04 AM   Modules accepted: Orders

## 2019-12-21 NOTE — Discharge Instructions (Signed)
DRINK PLENTY OF FLUIDS FOR THE NEXT 2-3 DAYS.  KEEP ARM ELEVATED THE REMAINDER OF THE DAY.  Radial Site Care  This sheet gives you information about how to care for yourself after your procedure. Your health care provider may also give you more specific instructions. If you have problems or questions, contact your health care provider. What can I expect after the procedure? After the procedure, it is common to have:  Bruising and tenderness at the catheter insertion area. Follow these instructions at home: Medicines  Take over-the-counter and prescription medicines only as told by your health care provider. Insertion site care 1. Follow instructions from your health care provider about how to take care of your insertion site. Make sure you: ? Wash your hands with soap and water before you change your bandage (dressing). If soap and water are not available, use hand sanitizer. ? Change your dressing as told by your health care provider. 2. Check your insertion site every day for signs of infection. Check for: ? Redness, swelling, or pain. ? Fluid or blood. ? Pus or a bad smell. ? Warmth. 3. Do not take baths, swim, or use a hot tub for 5 days. 4. You may shower 24-48 hours after the procedure. ? Remove the dressing and gently wash the site with plain soap and water. ? Pat the area dry with a clean towel. ? Do not rub the site. That could cause bleeding. 5. Do not apply powder or lotion to the site. Activity  1. For 24 hours after the procedure, or as directed by your health care provider: ? Do not flex or bend the affected arm. ? Do not push or pull heavy objects with the affected arm. ? Do not drive yourself home from the hospital or clinic. You may drive 24 hours after the procedure. ? Do not operate machinery or power tools. 2. Do not push, pull or lift anything that is heavier than 10 lb for 5 days. 3. Ask your health care provider when it is okay to: ? Return to work or  school. ? Resume usual physical activities or sports. ? Resume sexual activity. General instructions  If the catheter site starts to bleed, raise your arm and put firm pressure on the site. If the bleeding does not stop, get help right away. This is a medical emergency.  If you went home on the same day as your procedure, a responsible adult should be with you for the first 24 hours after you arrive home.  Keep all follow-up visits as told by your health care provider. This is important. Contact a health care provider if:  You have a fever.  You have redness, swelling, or yellow drainage around your insertion site. Get help right away if:  You have unusual pain at the radial site.  The catheter insertion area swells very fast.  The insertion area is bleeding, and the bleeding does not stop when you hold steady pressure on the area.  Your arm or hand becomes pale, cool, tingly, or numb. These symptoms may represent a serious problem that is an emergency. Do not wait to see if the symptoms will go away. Get medical help right away. Call your local emergency services (911 in the [place] Do not drive yourself to the hospital. Summary  After the procedure, it is common to have bruising and tenderness at the site.  Follow instructions from your health care provider about how to take care of your radial site wound. Check   the wound every day for signs of infection.  Do not push, pull or lift anything that is heavier than 10 lb for 5 days.  This information is not intended to replace advice given to you by your health care provider. Make sure you discuss any questions you have with your health care provider. Document Revised: 04/10/2017 Document Reviewed: 04/10/2017 Elsevier Patient Education  2020 Elsevier Inc. 

## 2020-01-05 ENCOUNTER — Other Ambulatory Visit: Payer: Self-pay

## 2020-01-05 ENCOUNTER — Encounter: Payer: Self-pay | Admitting: Cardiovascular Disease

## 2020-01-05 ENCOUNTER — Ambulatory Visit (INDEPENDENT_AMBULATORY_CARE_PROVIDER_SITE_OTHER): Payer: BC Managed Care – PPO | Admitting: Cardiovascular Disease

## 2020-01-05 DIAGNOSIS — E785 Hyperlipidemia, unspecified: Secondary | ICD-10-CM | POA: Insufficient documentation

## 2020-01-05 DIAGNOSIS — E782 Mixed hyperlipidemia: Secondary | ICD-10-CM

## 2020-01-05 DIAGNOSIS — R0789 Other chest pain: Secondary | ICD-10-CM | POA: Diagnosis not present

## 2020-01-05 MED ORDER — ROSUVASTATIN CALCIUM 5 MG PO TABS: 5.0000 mg | ORAL_TABLET | ORAL | 3 refills | Status: DC

## 2020-01-05 NOTE — Assessment & Plan Note (Signed)
History of atypical chest pain with recent left heart catheter performed via the right radial approach on 12/21/2019 that revealed normal coronary arteries and normal LV function.  I have reassured Ian Clark that his chest pain is noncardiac.

## 2020-01-05 NOTE — Patient Instructions (Signed)
Medication Instructions:   START Crestor 5 mg every other day  *If you need a refill on your cardiac medications before your next appointment, please call your pharmacy*  Lab Work: Your physician recommends that you return for lab work in: 2 MONTHS  FLP  LFT If you have labs (blood work) drawn today and your tests are completely normal, you will receive your results only by: [place] MyChart Message (if you have MyChart) OR . A paper copy in the mail If you have any lab test that is abnormal or we need to change your treatment, we will call you to review the results.  Testing/Procedures: NONE ordered at this time of appointment   Follow-Up: At [facility], you and your health needs are our priority.  As part of our continuing mission to provide you with exceptional heart care, we have created designated Provider Care Teams.  These Care Teams include your primary Cardiologist (physician) and Advanced Practice Providers (APPs -  Physician Assistants and Nurse Practitioners) who all work together to provide you with the care you need, when you need it.  Your next appointment:   6 month(s)  The format for your next appointment:   In Person  Provider:   Quay Burow, MD  Other Instructions

## 2020-01-05 NOTE — Assessment & Plan Note (Signed)
History of hyperlipidemia with recent lipid profile performed 12/17/2019 revealing total cholesterol 243, LDL 146 and HDL of 79.  He does have an elevated HDL however I am inclined to treat his LDL with a low-dose statin drug.  I am going to prescribe rosuvastatin 5 mg every other day we will recheck a lipid and liver profile in 2 months.

## 2020-01-05 NOTE — Progress Notes (Signed)
01/05/2020 Ian Clark   1961-03-01  [id number]  Primary Physician Glenford Bayley, DO Primary Cardiologist: Lorretta Harp MD Renae Gloss  HPI:  Ian Clark is a 59 y.o.  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  He was referred by the emergency room in [place], part of [facility], because of recently evaluated chest pain.    I last saw him in the office 12/17/2019.  He has no cardiac risk factors.  He does drink several beers and 1-2 bourbons a night.  He has a history of a hiatal hernia as well as GERD on antireflux medications.  He had a right inguinal hernia repair done laparoscopically at [facility] proxy 5 weeks ago.  There is no family history of heart disease.  Never had a heart attack or stroke.  He developed chest pressure last Friday which waxed and waned over the weekend.  He has chronic left upper extremity discomfort.  He was evaluated in the emergency room in [place] where his enzymes were negative and his EKG showed no acute changes.  They had a low suspicion that this was ischemically mediated.  He continues to have off-and-on chest pain  Because of his ongoing symptoms I elected to proceed with outpatient diagnostic coronary angiography on 12/21/2019 revealing normal coronaries and normal LV function.  His pain is somewhat improved.  He does complain of some "heart pounding" when he does strenuous activity.  He also has "whitecoat hypertension" with blood pressure measured last night at home at 116/77.   Current Meds  Medication Sig  . aspirin EC 81 MG tablet Take 1 tablet (81 mg total) by mouth daily. Swallow whole.  . b complex vitamins tablet Take 1 tablet by mouth daily.  . Misc Natural Products (GLUCOSAMINE CHOND COMPLEX/MSM PO) Take 1 tablet by mouth daily.  . Multiple Vitamins-Minerals (MULTIVITAMIN WITH MINERALS) tablet Take 1 tablet by mouth daily.  . Omega 3 1200 MG CAPS Take  1,200 mg by mouth daily.  . pantoprazole (PROTONIX) 40 MG tablet Take 40 mg by mouth daily.   Current Facility-Administered Medications for the 01/05/20 encounter (Office Visit) with Lorretta Harp, MD  Medication  . sodium chloride flush (NS) 0.9 % injection 3 mL     Allergies  Allergen Reactions  . Morphine And Related     Bp dropped     Social History   Socioeconomic History  . Marital status: Married    Spouse name: Not on file  . Number of children: Not on file  . Years of education: Not on file  . Highest education level: Not on file  Occupational History  . Not on file  Tobacco Use  . Smoking status: Former Research scientist (life sciences)  . Smokeless tobacco: Never Used  Vaping Use  . Vaping Use: Never used  Substance and Sexual Activity  . Alcohol use: Yes    Alcohol/week: 4.0 standard drinks    Types: 4 Shots of liquor per week  . Drug use: Never  . Sexual activity: Yes  Other Topics Concern  . Not on file  Social History Narrative  . Not on file   Social Determinants of Health   Financial Resource Strain:   . Difficulty of Paying Living Expenses: Not on file  Food Insecurity:   . Worried About Charity fundraiser in the Last Year: Not on file  . Ran Out of Food  in the Last Year: Not on file  Transportation Needs:   . Lack of Transportation (Medical): Not on file  . Lack of Transportation (Non-Medical): Not on file  Physical Activity:   . Days of Exercise per Week: Not on file  . Minutes of Exercise per Session: Not on file  Stress:   . Feeling of Stress : Not on file  Social Connections:   . Frequency of Communication with Friends and Family: Not on file  . Frequency of Social Gatherings with Friends and Family: Not on file  . Attends Religious Services: Not on file  . Active Member of Clubs or Organizations: Not on file  . Attends Archivist Meetings: Not on file  . Marital Status: Not on file  Intimate Partner Violence:   . Fear of Current or  Ex-Partner: Not on file  . Emotionally Abused: Not on file  . Physically Abused: Not on file  . Sexually Abused: Not on file     Review of Systems: General: negative for chills, fever, night sweats or weight changes.  Cardiovascular: negative for chest pain, dyspnea on exertion, edema, orthopnea, palpitations, paroxysmal nocturnal dyspnea or shortness of breath Dermatological: negative for rash Respiratory: negative for cough or wheezing Urologic: negative for hematuria Abdominal: negative for nausea, vomiting, diarrhea, bright red blood per rectum, melena, or hematemesis Neurologic: negative for visual changes, syncope, or dizziness All other systems reviewed and are otherwise negative except as noted above.    Blood pressure (!) 157/101, pulse 66, height 5\' 11"  (1.803 m), weight 200 lb 12.8 oz (91.1 kg), SpO2 100 %.  General appearance: alert and no distress Neck: no adenopathy, no carotid bruit, no JVD, supple, symmetrical, trachea midline and thyroid not enlarged, symmetric, no tenderness/mass/nodules Lungs: clear to auscultation bilaterally Heart: regular rate and rhythm, S1, S2 normal, no murmur, click, rub or gallop Extremities: extremities normal, atraumatic, no cyanosis or edema Pulses: 2+ and symmetric Skin: Skin color, texture, turgor normal. No rashes or lesions Neurologic: Alert and oriented X 3, normal strength and tone. Normal symmetric reflexes. Normal coordination and gait  EKG sinus rhythm at 66 without ST or T wave changes.  I personally reviewed this EKG.  ASSESSMENT AND PLAN:   Atypical chest pain History of atypical chest pain with recent left heart catheter performed via the right radial approach on 12/21/2019 that revealed normal coronary arteries and normal LV function.  I have reassured [name] that his chest pain is noncardiac.  Hyperlipidemia History of hyperlipidemia with recent lipid profile performed 12/17/2019 revealing total cholesterol 243, LDL 146  and HDL of 79.  He does have an elevated HDL however I am inclined to treat his LDL with a low-dose statin drug.  I am going to prescribe rosuvastatin 5 mg every other day we will recheck a lipid and liver profile in 2 months.      Lorretta Harp MD FACP,FACC,FAHA, [facility] 01/05/2020 8:32 AM

## 2020-01-08 ENCOUNTER — Ambulatory Visit: Payer: BC Managed Care – PPO | Admitting: Internal Medicine

## 2020-01-11 DIAGNOSIS — Z86010 Personal history of colonic polyps: Secondary | ICD-10-CM | POA: Diagnosis not present

## 2020-01-11 DIAGNOSIS — R079 Chest pain, unspecified: Secondary | ICD-10-CM | POA: Diagnosis not present

## 2020-01-11 DIAGNOSIS — K2100 Gastro-esophageal reflux disease with esophagitis, without bleeding: Secondary | ICD-10-CM | POA: Diagnosis not present

## 2020-01-11 DIAGNOSIS — K449 Diaphragmatic hernia without obstruction or gangrene: Secondary | ICD-10-CM | POA: Diagnosis not present

## 2020-01-12 ENCOUNTER — Other Ambulatory Visit: Payer: Self-pay | Admitting: Physician Assistant

## 2020-01-12 DIAGNOSIS — K2100 Gastro-esophageal reflux disease with esophagitis, without bleeding: Secondary | ICD-10-CM

## 2020-01-21 ENCOUNTER — Ambulatory Visit
Admission: RE | Admit: 2020-01-21 | Discharge: 2020-01-21 | Disposition: A | Discharge status: Home / Self Care | Payer: BC Managed Care – PPO | Source: Ambulatory Visit | Attending: Physician Assistant | Admitting: Physician Assistant

## 2020-01-21 DIAGNOSIS — K224 Dyskinesia of esophagus: Secondary | ICD-10-CM | POA: Diagnosis not present

## 2020-01-21 DIAGNOSIS — K2100 Gastro-esophageal reflux disease with esophagitis, without bleeding: Secondary | ICD-10-CM

## 2020-01-21 DIAGNOSIS — K449 Diaphragmatic hernia without obstruction or gangrene: Secondary | ICD-10-CM | POA: Diagnosis not present

## 2020-02-15 DIAGNOSIS — K219 Gastro-esophageal reflux disease without esophagitis: Secondary | ICD-10-CM | POA: Diagnosis not present

## 2020-02-23 DIAGNOSIS — L814 Other melanin hyperpigmentation: Secondary | ICD-10-CM | POA: Diagnosis not present

## 2020-02-23 DIAGNOSIS — D225 Melanocytic nevi of trunk: Secondary | ICD-10-CM | POA: Diagnosis not present

## 2020-02-23 DIAGNOSIS — D1801 Hemangioma of skin and subcutaneous tissue: Secondary | ICD-10-CM | POA: Diagnosis not present

## 2020-02-23 DIAGNOSIS — L570 Actinic keratosis: Secondary | ICD-10-CM | POA: Diagnosis not present

## 2020-05-03 DIAGNOSIS — E782 Mixed hyperlipidemia: Secondary | ICD-10-CM | POA: Diagnosis not present

## 2020-05-04 LAB — LIPID PANEL
Chol/HDL Ratio: 2.4 ratio (ref 0.0–5.0)
Cholesterol, Total: 227 mg/dL — ABNORMAL HIGH (ref 100–199)
HDL: 94 mg/dL (ref >39)
LDL Chol Calc (NIH): 118 mg/dL — ABNORMAL HIGH (ref 0–99)
Triglycerides: 90 mg/dL (ref 0–149)
VLDL Cholesterol Cal: 15 mg/dL (ref 5–40)

## 2020-05-04 LAB — HEPATIC FUNCTION PANEL
ALT: 32 IU/L (ref 0–44)
AST: 29 IU/L (ref 0–40)
Albumin: 4.5 g/dL (ref 3.8–4.9)
Alkaline Phosphatase: 79 IU/L (ref 44–121)
Bilirubin Total: 1 mg/dL (ref 0.0–1.2)
Bilirubin, Direct: 0.25 mg/dL (ref 0.00–0.40)
Total Protein: 6.9 g/dL (ref 6.0–8.5)

## 2020-08-27 ENCOUNTER — Ambulatory Visit (HOSPITAL_COMMUNITY)
Admission: RE | Admit: 2020-08-27 | Discharge: 2020-08-27 | Disposition: A | Discharge status: Home / Self Care | Payer: BC Managed Care – PPO | Source: Ambulatory Visit | Attending: Physician Assistant | Admitting: Physician Assistant

## 2020-08-27 ENCOUNTER — Other Ambulatory Visit: Payer: Self-pay | Admitting: Physician Assistant

## 2020-08-27 DIAGNOSIS — M25561 Pain in right knee: Secondary | ICD-10-CM | POA: Diagnosis not present

## 2020-08-27 DIAGNOSIS — M79604 Pain in right leg: Secondary | ICD-10-CM | POA: Diagnosis not present

## 2020-08-27 NOTE — Progress Notes (Signed)
Lower extremity venous RT study completed.  Attempted to call preliminary results to [place], [place].   See CV Proc for preliminary results report.   Darlin Coco, RDMS, RVT

## 2020-09-12 DIAGNOSIS — M25561 Pain in right knee: Secondary | ICD-10-CM | POA: Diagnosis not present

## 2020-09-23 DIAGNOSIS — R1030 Lower abdominal pain, unspecified: Secondary | ICD-10-CM | POA: Diagnosis not present

## 2020-09-23 DIAGNOSIS — K589 Irritable bowel syndrome without diarrhea: Secondary | ICD-10-CM | POA: Diagnosis not present

## 2020-09-23 DIAGNOSIS — K219 Gastro-esophageal reflux disease without esophagitis: Secondary | ICD-10-CM | POA: Diagnosis not present

## 2020-09-23 DIAGNOSIS — Z86010 Personal history of colonic polyps: Secondary | ICD-10-CM | POA: Diagnosis not present

## 2020-09-26 ENCOUNTER — Other Ambulatory Visit: Payer: Self-pay

## 2020-09-26 ENCOUNTER — Encounter: Payer: Self-pay | Admitting: Family Medicine

## 2020-09-26 ENCOUNTER — Ambulatory Visit (INDEPENDENT_AMBULATORY_CARE_PROVIDER_SITE_OTHER): Payer: BC Managed Care – PPO | Admitting: Family Medicine

## 2020-09-26 VITALS — BP 153/91 | HR 81 | Temp 98.3°F | Ht 70.28 in | Wt 197.4 lb

## 2020-09-26 DIAGNOSIS — K449 Diaphragmatic hernia without obstruction or gangrene: Secondary | ICD-10-CM | POA: Diagnosis not present

## 2020-09-26 DIAGNOSIS — K219 Gastro-esophageal reflux disease without esophagitis: Secondary | ICD-10-CM | POA: Insufficient documentation

## 2020-09-26 DIAGNOSIS — R918 Other nonspecific abnormal finding of lung field: Secondary | ICD-10-CM | POA: Diagnosis not present

## 2020-09-26 DIAGNOSIS — E782 Mixed hyperlipidemia: Secondary | ICD-10-CM | POA: Diagnosis not present

## 2020-09-26 DIAGNOSIS — R109 Unspecified abdominal pain: Secondary | ICD-10-CM | POA: Insufficient documentation

## 2020-09-26 NOTE — Patient Instructions (Signed)
Great to meet you today! You can schedule CT scan with imaging department downstairs or they will call you to schedule.  We will be in touch with these results

## 2020-09-26 NOTE — Assessment & Plan Note (Signed)
Reflux symptoms are well managed with pantoprazole.

## 2020-09-26 NOTE — Assessment & Plan Note (Signed)
High contrast CT of the chest ordered for better characterization of nodular opacity.  He denies any symptoms related to this at this time.

## 2020-09-26 NOTE — Assessment & Plan Note (Signed)
Hiatal hernia mimicking cardiac chest pain.  Clean cardiac cath last year.  He does have a gastroenterologist.

## 2020-09-26 NOTE — Assessment & Plan Note (Signed)
He is taking rosuvastatin every other day.  Tolerating well.  He will continue management of this per cardiology.

## 2020-09-26 NOTE — Progress Notes (Signed)
Ian Clark - 60 y.o. male MRN [id number]  Date of birth: May 08, 1960  Subjective Chief Complaint  Patient presents with   Establish Care    HPI Ian Clark is a 60 year old male here today for initial visit to establish care.  He has a previous medical history of hiatal hernia with GERD and hyperlipidemia.  He does see a cardiologist (Dr. Gwenlyn Found) regularly.  He also has a gastroenterologist (Dr. Paulita Fujita).  He does have reflux associated with his hiatal hernia which he takes pantoprazole for.  He does have episodes of recurrent chest pain related to is hiatal hernia which has mimic cardiac chest pain.  He has had cardiac catheterization last October showing normal coronaries and normal LV function.  He does have some mild anxiety as well which may be contributory to his chest pain.  He was seen recently and an emergency room while traveling in [place] for chest pain.  He did have a chest x-ray showing nodular retrocardiac opacity.  Follow-up CT scan was recommended for further clarification of this.  He does have a remote history of smoking, quit 20 years ago.  He was a light smoker at that time.  He denies any occupational exposure.  He is taking rosuvastatin every other day for management of hyperlipidemia.  He is tolerating this well.  ROS:  A comprehensive ROS was completed and negative except as noted per HPI 1 Allergies  Allergen Reactions   Morphine And Related Other (See Comments)    Severe Hypotension    Past Medical History:  Diagnosis Date   Hx of colonic polyps    PONV (postoperative nausea and vomiting)     Past Surgical History:  Procedure Laterality Date   COLONOSCOPY     EYE SURGERY Left    laser retinal surgery   HERNIA REPAIR     x2   INGUINAL HERNIA REPAIR Left 11/09/2019   Procedure: LAPAROSCOPIC LEFT INGUINAL HERNIA WITH MESH;  Surgeon: Jesusita Oka, MD;  Location: [facility];  Service: General;  Laterality: Left;   LEFT HEART CATH AND CORONARY ANGIOGRAPHY N/A  12/21/2019   Procedure: LEFT HEART CATH AND CORONARY ANGIOGRAPHY;  Surgeon: Lorretta Harp, MD;  Location: [facility];  Service: Cardiovascular;  Laterality: N/A;   RESECTION DISTAL CLAVICAL     metal there   SHOULDER SURGERY Left    TONSILLECTOMY     Ulnar nerve surgery     WISDOM TOOTH EXTRACTION      Social History   Socioeconomic History   Marital status: Married    Spouse name: Not on file   Number of children: Not on file   Years of education: Not on file   Highest education level: Not on file  Occupational History   Not on file  Tobacco Use   Smoking status: Former    Pack years: 0.00   Smokeless tobacco: Never  Vaping Use   Vaping Use: Never used  Substance and Sexual Activity   Alcohol use: Not Currently    Alcohol/week: 14.0 standard drinks    Types: 14 Standard drinks or equivalent per week   Drug use: Never   Sexual activity: Yes  Other Topics Concern   Not on file  Social History Narrative   Not on file   Social Determinants of Health   Financial Resource Strain: Not on file  Food Insecurity: Not on file  Transportation Needs: Not on file  Physical Activity: Not on file  Stress: Not on file  Social Connections: Not on file    Family History  Problem Relation Age of Onset   Hypertension Mother    Hypertension Father     Health Maintenance  Topic Date Due   Pneumococcal Vaccine 97-21 Years old (1 - PCV) Never done   HIV Screening  Never done   Hepatitis C Screening  Never done   COLONOSCOPY (Pts 45-57yrs Insurance coverage will need to be confirmed)  Never done   COVID-19 Vaccine (4 - Booster for Moderna series) 07/23/2020   INFLUENZA VACCINE  10/17/2020   TETANUS/TDAP  11/17/2025   Zoster Vaccines- Shingrix  Completed   HPV VACCINES  Aged Out      ----------------------------------------------------------------------------------------------------------------------------------------------------------------------------------------------------------------- Physical Exam BP (!) 153/91 (BP Location: Left Arm, Patient Position: Sitting, Cuff Size: Normal)   Pulse 81   Temp 98.3 F (36.8 C)   Ht 5' 10.28" (1.785 m)   Wt 197 lb 6.4 oz (89.5 kg)   SpO2 99%   BMI 28.10 kg/m   Physical Exam Constitutional:      Appearance: Normal appearance.  HENT:     Head: Normocephalic and atraumatic.  Eyes:     General: No scleral icterus. Cardiovascular:     Rate and Rhythm: Normal rate and regular rhythm.  Pulmonary:     Effort: Pulmonary effort is normal.     Breath sounds: Normal breath sounds.  Neurological:     General: No focal deficit present.     Mental Status: He is alert.  Psychiatric:        Mood and Affect: Mood normal.        Behavior: Behavior normal.    ------------------------------------------------------------------------------------------------------------------------------------------------------------------------------------------------------------------- Assessment and Plan  Opacity of lung on imaging study High contrast CT of the chest ordered for better characterization of nodular opacity.  He denies any symptoms related to this at this time.  Gastroesophageal reflux disease Reflux symptoms are well managed with pantoprazole.  Hyperlipidemia He is taking rosuvastatin every other day.  Tolerating well.  He will continue management of this per cardiology.  Hiatal hernia Hiatal hernia mimicking cardiac chest pain.  Clean cardiac cath last year.  He does have a gastroenterologist.  With the   No orders of the defined types were placed in this encounter.   No follow-ups on file.    This visit occurred during the SARS-CoV-2 public health emergency.  Safety protocols were in place, including screening  questions prior to the visit, additional usage of staff PPE, and extensive cleaning of exam room while observing appropriate contact time as indicated for disinfecting solutions.

## 2020-09-28 ENCOUNTER — Ambulatory Visit (INDEPENDENT_AMBULATORY_CARE_PROVIDER_SITE_OTHER): Payer: BC Managed Care – PPO

## 2020-09-28 ENCOUNTER — Other Ambulatory Visit: Payer: BC Managed Care – PPO

## 2020-09-28 ENCOUNTER — Other Ambulatory Visit: Payer: Self-pay

## 2020-09-28 DIAGNOSIS — I2510 Atherosclerotic heart disease of native coronary artery without angina pectoris: Secondary | ICD-10-CM | POA: Diagnosis not present

## 2020-09-28 DIAGNOSIS — R918 Other nonspecific abnormal finding of lung field: Secondary | ICD-10-CM

## 2020-09-28 DIAGNOSIS — J8410 Pulmonary fibrosis, unspecified: Secondary | ICD-10-CM | POA: Diagnosis not present

## 2020-09-28 DIAGNOSIS — J984 Other disorders of lung: Secondary | ICD-10-CM | POA: Diagnosis not present

## 2020-09-28 DIAGNOSIS — K449 Diaphragmatic hernia without obstruction or gangrene: Secondary | ICD-10-CM | POA: Diagnosis not present

## 2020-10-17 ENCOUNTER — Encounter: Payer: Self-pay | Admitting: Family Medicine

## 2020-10-17 ENCOUNTER — Other Ambulatory Visit: Payer: Self-pay

## 2020-10-17 ENCOUNTER — Ambulatory Visit (INDEPENDENT_AMBULATORY_CARE_PROVIDER_SITE_OTHER): Payer: BC Managed Care – PPO | Admitting: Family Medicine

## 2020-10-17 DIAGNOSIS — R1084 Generalized abdominal pain: Secondary | ICD-10-CM | POA: Diagnosis not present

## 2020-10-17 NOTE — Progress Notes (Signed)
Ian Clark - 60 y.o. male MRN [id number]  Date of birth: Jan 10, 1961  Subjective Chief Complaint  Patient presents with   Abdominal Pain    HPI Ian Clark is a 60 year old male here today for follow-up of abdominal pain.  He has had issues with recurrent abdominal discomfort.  This is located across the abdomen.  This tends to occur at random times.  It does seem to worsen if sitting for prolonged period of time or standing for prolonged period of time.  He has not really had any other GI issues including changes to bowel movements or increased heartburn.  He has seen GI and had upper GI study showing small hiatal hernia with reflux.  CT of the abdomen showed small amount of fat in the left inguinal ring without hernia.  He did have endoscopy/colonoscopy previously as well which was unremarkable.  ROS:  A comprehensive ROS was completed and negative except as noted per HPI  Allergies  Allergen Reactions   Morphine And Related Other (See Comments)    Severe Hypotension    Past Medical History:  Diagnosis Date   Hx of colonic polyps    PONV (postoperative nausea and vomiting)     Past Surgical History:  Procedure Laterality Date   COLONOSCOPY     EYE SURGERY Left    laser retinal surgery   HERNIA REPAIR     x2   INGUINAL HERNIA REPAIR Left 11/09/2019   Procedure: LAPAROSCOPIC LEFT INGUINAL HERNIA WITH MESH;  Surgeon: Jesusita Oka, MD;  Location: [facility];  Service: General;  Laterality: Left;   LEFT HEART CATH AND CORONARY ANGIOGRAPHY N/A 12/21/2019   Procedure: LEFT HEART CATH AND CORONARY ANGIOGRAPHY;  Surgeon: Lorretta Harp, MD;  Location: [facility];  Service: Cardiovascular;  Laterality: N/A;   RESECTION DISTAL CLAVICAL     metal there   SHOULDER SURGERY Left    TONSILLECTOMY     Ulnar nerve surgery     WISDOM TOOTH EXTRACTION      Social History   Socioeconomic History   Marital status: Married    Spouse name: Not on file   Number of children: Not on file    Years of education: Not on file   Highest education level: Not on file  Occupational History   Not on file  Tobacco Use   Smoking status: Former   Smokeless tobacco: Never  Vaping Use   Vaping Use: Never used  Substance and Sexual Activity   Alcohol use: Not Currently    Alcohol/week: 14.0 standard drinks    Types: 14 Standard drinks or equivalent per week   Drug use: Never   Sexual activity: Yes  Other Topics Concern   Not on file  Social History Narrative   Not on file   Social Determinants of Health   Financial Resource Strain: Not on file  Food Insecurity: Not on file  Transportation Needs: Not on file  Physical Activity: Not on file  Stress: Not on file  Social Connections: Not on file    Family History  Problem Relation Age of Onset   Hypertension Mother    Hypertension Father     Health Maintenance  Topic Date Due   Pneumococcal Vaccine 85-9 Years old (1 - PCV) Never done   HIV Screening  Never done   Hepatitis C Screening  Never done   COLONOSCOPY (Pts 45-42yr Insurance coverage will need to be confirmed)  Never done   COVID-19 Vaccine (4 -  Booster for Moderna series) 07/23/2020   INFLUENZA VACCINE  10/17/2020   TETANUS/TDAP  11/17/2025   Zoster Vaccines- Shingrix  Completed   HPV VACCINES  Aged Out     ----------------------------------------------------------------------------------------------------------------------------------------------------------------------------------------------------------------- Physical Exam BP (!) 154/90 (BP Location: Left Arm, Patient Position: Sitting, Cuff Size: Normal)   Pulse 67   Temp (!) 97.5 F (36.4 C)   Ht '5\' 11"'$  (1.803 m)   Wt 196 lb (88.9 kg)   SpO2 100%   BMI 27.34 kg/m   Physical Exam Constitutional:      Appearance: He is well-developed.  Eyes:     General: No scleral icterus. Cardiovascular:     Rate and Rhythm: Normal rate and regular rhythm.  Pulmonary:     Effort: Pulmonary effort is  normal.     Breath sounds: Normal breath sounds.  Abdominal:     General: Abdomen is flat. There is no distension.     Palpations: Abdomen is soft. There is no mass.     Tenderness: There is no abdominal tenderness. There is no guarding.     Hernia: No hernia is present.  Neurological:     General: No focal deficit present.     Mental Status: He is alert.  Psychiatric:        Mood and Affect: Mood normal.        Behavior: Behavior normal.    ------------------------------------------------------------------------------------------------------------------------------------------------------------------------------------------------------------------- Assessment and Plan  Abdominal pain This is chronic in nature.  He has had extensive work-up for this previously.  This may be related to previous hernia repair and or some core weakness.  Given handout on core strengthening exercises if not improving with this we can consider trial of formal PT.   No orders of the defined types were placed in this encounter.   No follow-ups on file.    This visit occurred during the SARS-CoV-2 public health emergency.  Safety protocols were in place, including screening questions prior to the visit, additional usage of staff PPE, and extensive cleaning of exam room while observing appropriate contact time as indicated for disinfecting solutions.

## 2020-10-17 NOTE — Assessment & Plan Note (Signed)
This is chronic in nature.  He has had extensive work-up for this previously.  This may be related to previous hernia repair and or some core weakness.  Given handout on core strengthening exercises if not improving with this we can consider trial of formal PT.

## 2020-11-11 ENCOUNTER — Encounter: Payer: Self-pay | Admitting: Family Medicine

## 2020-11-14 DIAGNOSIS — S01412A Laceration without foreign body of left cheek and temporomandibular area, initial encounter: Secondary | ICD-10-CM | POA: Diagnosis not present

## 2020-11-14 DIAGNOSIS — Z23 Encounter for immunization: Secondary | ICD-10-CM | POA: Diagnosis not present

## 2020-11-17 ENCOUNTER — Other Ambulatory Visit: Payer: Self-pay

## 2020-11-17 ENCOUNTER — Ambulatory Visit (INDEPENDENT_AMBULATORY_CARE_PROVIDER_SITE_OTHER): Payer: BC Managed Care – PPO | Admitting: Family Medicine

## 2020-11-17 ENCOUNTER — Encounter: Payer: Self-pay | Admitting: Family Medicine

## 2020-11-17 VITALS — BP 151/98 | HR 77 | Temp 97.6°F | Ht 71.0 in | Wt 195.0 lb

## 2020-11-17 DIAGNOSIS — E782 Mixed hyperlipidemia: Secondary | ICD-10-CM | POA: Diagnosis not present

## 2020-11-17 DIAGNOSIS — Z0000 Encounter for general adult medical examination without abnormal findings: Secondary | ICD-10-CM | POA: Diagnosis not present

## 2020-11-17 DIAGNOSIS — Z125 Encounter for screening for malignant neoplasm of prostate: Secondary | ICD-10-CM | POA: Diagnosis not present

## 2020-11-17 MED ORDER — ROSUVASTATIN CALCIUM 5 MG PO TABS: 5.0000 mg | ORAL_TABLET | ORAL | 3 refills | Status: DC

## 2020-11-17 MED ORDER — LORAZEPAM 1 MG PO TABS: 1.0000 mg | ORAL_TABLET | Freq: Three times a day (TID) | ORAL | 0 refills | Status: DC | PRN

## 2020-11-17 NOTE — Assessment & Plan Note (Signed)
Well adult Orders Placed This Encounter  Procedures  . COMPLETE METABOLIC PANEL WITH GFR  . CBC with Differential  . PSA  Screening: PSA Immunizations: He will consider flu vaccine.  Anticipatory guidance/Risk factor reduction:  Recommendations per AVS

## 2020-11-17 NOTE — Patient Instructions (Signed)
Preventive Care 40-61 Years Old, Male Preventive care refers to lifestyle choices and visits with your health care provider that can promote health and wellness. This includes: A yearly physical exam. This is also called an annual wellness visit. Regular dental and eye exams. Immunizations. Screening for certain conditions. Healthy lifestyle choices, such as: Eating a healthy diet. Getting regular exercise. Not using drugs or products that contain nicotine and tobacco. Limiting alcohol use. What can I expect for my preventive care visit? Physical exam Your health care provider will check your: Height and weight. These may be used to calculate your BMI (body mass index). BMI is a measurement that tells if you are at a healthy weight. Heart rate and blood pressure. Body temperature. Skin for abnormal spots. Counseling Your health care provider may ask you questions about your: Past medical problems. Family's medical history. Alcohol, tobacco, and drug use. Emotional well-being. Home life and relationship well-being. Sexual activity. Diet, exercise, and sleep habits. Work and work environment. Access to firearms. What immunizations do I need? Vaccines are usually given at various ages, according to a schedule. Your health care provider will recommend vaccines for you based on your age, medical history, and lifestyle or other factors, such as travel or where you work. What tests do I need? Blood tests Lipid and cholesterol levels. These may be checked every 5 years, or more often if you are over 50 years old. Hepatitis C test. Hepatitis B test. Screening Lung cancer screening. You may have this screening every year starting at age 55 if you have a 30-pack-year history of smoking and currently smoke or have quit within the past 15 years. Prostate cancer screening. Recommendations will vary depending on your family history and other risks. Genital exam to check for testicular cancer  or hernias. Colorectal cancer screening. All adults should have this screening starting at age 50 and continuing until age 75. Your health care provider may recommend screening at age 45 if you are at increased risk. You will have tests every 1-10 years, depending on your results and the type of screening test. Diabetes screening. This is done by checking your blood sugar (glucose) after you have not eaten for a while (fasting). You may have this done every 1-3 years. STD (sexually transmitted disease) testing, if you are at risk. Follow these instructions at home: Eating and drinking  Eat a diet that includes fresh fruits and vegetables, whole grains, lean protein, and low-fat dairy products. Take vitamin and mineral supplements as recommended by your health care provider. Do not drink alcohol if your health care provider tells you not to drink. If you drink alcohol: Limit how much you have to 0-2 drinks a day. Be aware of how much alcohol is in your drink. In the [place] one drink equals one 12 oz bottle of beer (355 mL), one 5 oz glass of wine (148 mL), or one 1 oz glass of hard liquor (44 mL). Lifestyle Take daily care of your teeth and gums. Brush your teeth every morning and night with fluoride toothpaste. Floss one time each day. Stay active. Exercise for at least 30 minutes 5 or more days each week. Do not use any products that contain nicotine or tobacco, such as cigarettes, e-cigarettes, and chewing tobacco. If you need help quitting, ask your health care provider. Do not use drugs. If you are sexually active, practice safe sex. Use a condom or other form of protection to prevent STIs (sexually transmitted infections). If told by your   health care provider, take low-dose aspirin daily starting at age 50. Find healthy ways to cope with stress, such as: Meditation, yoga, or listening to music. Journaling. Talking to a trusted person. Spending time with friends and  family. Safety Always wear your seat belt while driving or riding in a vehicle. Do not drive: If you have been drinking alcohol. Do not ride with someone who has been drinking. When you are tired or distracted. While texting. Wear a helmet and other protective equipment during sports activities. If you have firearms in your house, make sure you follow all gun safety procedures. What's next? Go to your health care provider once a year for an annual wellness visit. Ask your health care provider how often you should have your eyes and teeth checked. Stay up to date on all vaccines. This information is not intended to replace advice given to you by your health care provider. Make sure you discuss any questions you have with your health care provider. Document Revised: 05/13/2020 Document Reviewed: 02/27/2018 Elsevier Patient Education  2022 Elsevier Inc.   

## 2020-11-17 NOTE — Progress Notes (Signed)
Ian Clark - 60 y.o. male MRN [id number]  Date of birth: 1960-11-24  Subjective Chief Complaint  Patient presents with   Annual Exam    HPI Ian Clark is a 60 y.o. male here today for annual exam.  He feels good and denies new concerns today.  He will be traveling and is requesting a renewal for lorazepam to take before flying.  He was recently head butted by his horse resulting in laceration over the left cheek.  He was seen a urgent care and had sutures placed.    He sees cardiology and had lipid panel completed earlier this year.  Taking rosuvastatin every other day.   He does stay pretty active and follows a pretty healthy diet.   He is a non-smoker and consumes 1-2 servings of EtOH per day.  Review of Systems  Constitutional:  Negative for chills, fever, malaise/fatigue and weight loss.  HENT:  Negative for congestion, ear pain and sore throat.   Eyes:  Negative for blurred vision, double vision and pain.  Respiratory:  Negative for cough and shortness of breath.   Cardiovascular:  Negative for chest pain and palpitations.  Gastrointestinal:  Negative for abdominal pain, blood in stool, constipation, heartburn and nausea.  Genitourinary:  Negative for dysuria and urgency.  Musculoskeletal:  Negative for joint pain and myalgias.  Neurological:  Negative for dizziness and headaches.  Endo/Heme/Allergies:  Does not bruise/bleed easily.  Psychiatric/Behavioral:  Negative for depression. The patient is not nervous/anxious and does not have insomnia.    Allergies  Allergen Reactions   Morphine And Related Other (See Comments)    Severe Hypotension    Past Medical History:  Diagnosis Date   Hx of colonic polyps    PONV (postoperative nausea and vomiting)     Past Surgical History:  Procedure Laterality Date   COLONOSCOPY     EYE SURGERY Left    laser retinal surgery   HERNIA REPAIR     x2   INGUINAL HERNIA REPAIR Left 11/09/2019   Procedure: LAPAROSCOPIC LEFT  INGUINAL HERNIA WITH MESH;  Surgeon: Jesusita Oka, MD;  Location: [facility];  Service: General;  Laterality: Left;   LEFT HEART CATH AND CORONARY ANGIOGRAPHY N/A 12/21/2019   Procedure: LEFT HEART CATH AND CORONARY ANGIOGRAPHY;  Surgeon: Lorretta Harp, MD;  Location: [facility];  Service: Cardiovascular;  Laterality: N/A;   RESECTION DISTAL CLAVICAL     metal there   SHOULDER SURGERY Left    TONSILLECTOMY     Ulnar nerve surgery     WISDOM TOOTH EXTRACTION      Social History   Socioeconomic History   Marital status: Married    Spouse name: Not on file   Number of children: Not on file   Years of education: Not on file   Highest education level: Not on file  Occupational History   Not on file  Tobacco Use   Smoking status: Former   Smokeless tobacco: Never  Vaping Use   Vaping Use: Never used  Substance and Sexual Activity   Alcohol use: Not Currently    Alcohol/week: 14.0 standard drinks    Types: 14 Standard drinks or equivalent per week   Drug use: Never   Sexual activity: Yes  Other Topics Concern   Not on file  Social History Narrative   Not on file   Social Determinants of Health   Financial Resource Strain: Not on file  Food Insecurity: Not on file  Transportation Needs: Not on file  Physical Activity: Not on file  Stress: Not on file  Social Connections: Not on file    Family History  Problem Relation Age of Onset   Hypertension Mother    Hypertension Father     Health Maintenance  Topic Date Due   Pneumococcal Vaccine 29-64 Years old (1 - PCV) Never done   HIV Screening  Never done   Hepatitis C Screening  Never done   COVID-19 Vaccine (4 - Booster for Moderna series) 07/23/2020   COLONOSCOPY (Pts 45-41yr Insurance coverage will need to be confirmed)  09/11/2020   INFLUENZA VACCINE  10/17/2020   TETANUS/TDAP  11/15/2030   Zoster Vaccines- Shingrix  Completed   HPV VACCINES  Aged Out      ----------------------------------------------------------------------------------------------------------------------------------------------------------------------------------------------------------------- Physical Exam BP (!) 151/98 (BP Location: Left Arm, Patient Position: Sitting, Cuff Size: Large)   Pulse 77   Temp 97.6 F (36.4 C)   Ht '5\' 11"'$  (1.803 m)   Wt 195 lb (88.5 kg)   SpO2 100%   BMI 27.20 kg/m   Physical Exam Constitutional:      General: He is not in acute distress. HENT:     Head:     Comments: Laceration above L cheek.  4 sutures noted.     Right Ear: Tympanic membrane and external ear normal.     Left Ear: Tympanic membrane and external ear normal.  Eyes:     General: No scleral icterus. Neck:     Thyroid: No thyromegaly.  Cardiovascular:     Rate and Rhythm: Normal rate and regular rhythm.     Heart sounds: Normal heart sounds.  Pulmonary:     Effort: Pulmonary effort is normal.     Breath sounds: Normal breath sounds.  Abdominal:     General: Bowel sounds are normal. There is no distension.     Palpations: Abdomen is soft.     Tenderness: There is no abdominal tenderness. There is no guarding.  Musculoskeletal:     Cervical back: Normal range of motion.  Lymphadenopathy:     Cervical: No cervical adenopathy.  Skin:    General: Skin is warm and dry.     Findings: No rash.  Neurological:     Mental Status: He is alert and oriented to person, place, and time.     Cranial Nerves: No cranial nerve deficit.     Motor: No abnormal muscle tone.  Psychiatric:        Mood and Affect: Mood normal.        Behavior: Behavior normal.    ------------------------------------------------------------------------------------------------------------------------------------------------------------------------------------------------------------------- Assessment and Plan  Well adult exam Well adult Orders Placed This Encounter  Procedures   COMPLETE  METABOLIC PANEL WITH GFR   CBC with Differential   PSA  Screening: PSA Immunizations: He will consider flu vaccine.  Anticipatory guidance/Risk factor reduction:  Recommendations per AVS   Meds ordered this encounter  Medications   rosuvastatin (CRESTOR) 5 MG tablet    Sig: Take 1 tablet (5 mg total) by mouth every other day.    Dispense:  45 tablet    Refill:  3   LORazepam (ATIVAN) 1 MG tablet    Sig: Take 1 tablet (1 mg total) by mouth every 8 (eight) hours as needed for anxiety (Take 1 hour before flight).    Dispense:  15 tablet    Refill:  0    Return for nurse visit for BP check/Flu shot in 2-3 weeks.  This visit occurred during the SARS-CoV-2 public health emergency.  Safety protocols were in place, including screening questions prior to the visit, additional usage of staff PPE, and extensive cleaning of exam room while observing appropriate contact time as indicated for disinfecting solutions.

## 2020-11-18 DIAGNOSIS — Z4802 Encounter for removal of sutures: Secondary | ICD-10-CM | POA: Diagnosis not present

## 2020-11-18 LAB — CBC WITH DIFFERENTIAL/PLATELET
Absolute Monocytes: 645 cells/uL (ref 200–950)
Basophils Absolute: 60 cells/uL (ref 0–200)
Basophils Relative: 0.8 %
Eosinophils Absolute: 143 cells/uL (ref 15–500)
Eosinophils Relative: 1.9 %
HCT: 46.1 % (ref 38.5–50.0)
Hemoglobin: 15 g/dL (ref 13.2–17.1)
Lymphs Abs: 1275 cells/uL (ref 850–3900)
MCH: 29.9 pg (ref 27.0–33.0)
MCHC: 32.5 g/dL (ref 32.0–36.0)
MCV: 91.8 fL (ref 80.0–100.0)
MPV: 11.6 fL (ref 7.5–12.5)
Monocytes Relative: 8.6 %
Neutro Abs: 5378 cells/uL (ref 1500–7800)
Neutrophils Relative %: 71.7 %
Platelets: 298 10*3/uL (ref 140–400)
RBC: 5.02 10*6/uL (ref 4.20–5.80)
RDW: 12.5 % (ref 11.0–15.0)
Total Lymphocyte: 17 %
WBC: 7.5 10*3/uL (ref 3.8–10.8)

## 2020-11-18 LAB — COMPLETE METABOLIC PANEL WITH GFR
AG Ratio: 2.1 (calc) (ref 1.0–2.5)
ALT: 22 U/L (ref 9–46)
AST: 20 U/L (ref 10–35)
Albumin: 4.4 g/dL (ref 3.6–5.1)
Alkaline phosphatase (APISO): 64 U/L (ref 35–144)
BUN/Creatinine Ratio: 16 (calc) (ref 6–22)
BUN: 11 mg/dL (ref 7–25)
CO2: 27 mmol/L (ref 20–32)
Calcium: 9.4 mg/dL (ref 8.6–10.3)
Chloride: 105 mmol/L (ref 98–110)
Creat: 0.67 mg/dL — ABNORMAL LOW (ref 0.70–1.35)
Globulin: 2.1 g/dL (calc) (ref 1.9–3.7)
Glucose, Bld: 101 mg/dL — ABNORMAL HIGH (ref 65–99)
Potassium: 4.4 mmol/L (ref 3.5–5.3)
Sodium: 140 mmol/L (ref 135–146)
Total Bilirubin: 0.7 mg/dL (ref 0.2–1.2)
Total Protein: 6.5 g/dL (ref 6.1–8.1)
eGFR: 106 mL/min/{1.73_m2} (ref >=60)

## 2020-11-18 LAB — PSA: PSA: 0.42 ng/mL (ref <=4.00)

## 2020-12-02 ENCOUNTER — Ambulatory Visit (INDEPENDENT_AMBULATORY_CARE_PROVIDER_SITE_OTHER): Payer: BC Managed Care – PPO | Admitting: Family Medicine

## 2020-12-02 ENCOUNTER — Ambulatory Visit: Payer: BC Managed Care – PPO

## 2020-12-02 VITALS — BP 135/86 | HR 71

## 2020-12-02 DIAGNOSIS — Z23 Encounter for immunization: Secondary | ICD-10-CM | POA: Diagnosis not present

## 2020-12-02 DIAGNOSIS — R030 Elevated blood-pressure reading, without diagnosis of hypertension: Secondary | ICD-10-CM

## 2020-12-02 NOTE — Progress Notes (Signed)
Established Patient Office Visit  Subjective:  Patient ID: Ian Clark, male    DOB: December 22, 1960  Age: 60 y.o. MRN: [id number]  CC:  Chief Complaint  Patient presents with   Hypertension    HPI Ian Clark presents for blood pressure check. Denies chest pain, shortness of breath or dizziness.   In office reading 135/99 with our monitor. 140/93 with his home monitor. Within normal limits.   Home blood pressure reading - 126/80 109/74 114/78 91/65 115/70 109/77 108/67 105/66 105/70  Past Medical History:  Diagnosis Date   Hx of colonic polyps    PONV (postoperative nausea and vomiting)     Past Surgical History:  Procedure Laterality Date   COLONOSCOPY     EYE SURGERY Left    laser retinal surgery   HERNIA REPAIR     x2   INGUINAL HERNIA REPAIR Left 11/09/2019   Procedure: LAPAROSCOPIC LEFT INGUINAL HERNIA WITH MESH;  Surgeon: Diamantina Monks, MD;  Location: [facility] OR;  Service: General;  Laterality: Left;   LEFT HEART CATH AND CORONARY ANGIOGRAPHY N/A 12/21/2019   Procedure: LEFT HEART CATH AND CORONARY ANGIOGRAPHY;  Surgeon: Runell Gess, MD;  Location: [facility];  Service: Cardiovascular;  Laterality: N/A;   RESECTION DISTAL CLAVICAL     metal there   SHOULDER SURGERY Left    TONSILLECTOMY     Ulnar nerve surgery     WISDOM TOOTH EXTRACTION      Family History  Problem Relation Age of Onset   Hypertension Mother    Hypertension Father     Social History   Socioeconomic History   Marital status: Married    Spouse name: Not on file   Number of children: Not on file   Years of education: Not on file   Highest education level: Not on file  Occupational History   Not on file  Tobacco Use   Smoking status: Former   Smokeless tobacco: Never  Vaping Use   Vaping Use: Never used  Substance and Sexual Activity   Alcohol use: Not Currently    Alcohol/week: 14.0 standard drinks    Types: 14 Standard drinks or equivalent per week   Drug  use: Never   Sexual activity: Yes  Other Topics Concern   Not on file  Social History Narrative   Not on file   Social Determinants of Health   Financial Resource Strain: Not on file  Food Insecurity: Not on file  Transportation Needs: Not on file  Physical Activity: Not on file  Stress: Not on file  Social Connections: Not on file  Intimate Partner Violence: Not on file    Outpatient Medications Prior to Visit  Medication Sig Dispense Refill   aspirin EC 81 MG tablet Take 1 tablet (81 mg total) by mouth daily. Swallow whole. 90 tablet 3   b complex vitamins tablet Take 1 tablet by mouth daily.     BL GLUCOSAMINE-CHONDROITIN PO Take by mouth.     LORazepam (ATIVAN) 1 MG tablet Take 1 tablet (1 mg total) by mouth every 8 (eight) hours as needed for anxiety (Take 1 hour before flight). 15 tablet 0   Multiple Vitamins-Minerals (MULTIVITAMIN ADULTS PO) Take 1 tablet by mouth daily.     Omega-3 Fatty Acids (FISH OIL) 1000 MG CAPS Take 1 capsule by mouth daily.     pantoprazole (PROTONIX) 40 MG tablet Take 40 mg by mouth daily.     rosuvastatin (CRESTOR) 5 MG  tablet Take 1 tablet (5 mg total) by mouth every other day. 45 tablet 3   sildenafil (VIAGRA) 100 MG tablet Take by mouth.     No facility-administered medications prior to visit.    Allergies  Allergen Reactions   Morphine And Related Other (See Comments)    Severe Hypotension    ROS Review of Systems    Objective:    Physical Exam  BP (!) 135/99   Pulse 71   SpO2 100%  Wt Readings from Last 3 Encounters:  11/17/20 195 lb (88.5 kg)  10/17/20 196 lb (88.9 kg)  09/26/20 197 lb 6.4 oz (89.5 kg)     Health Maintenance Due  Topic Date Due   Pneumococcal Vaccine 36-41 Years old (1 - PCV) Never done   HIV Screening  Never done   Hepatitis C Screening  Never done   COVID-19 Vaccine (4 - Booster for Moderna series) 07/23/2020   COLONOSCOPY (Pts 45-79yrs Insurance coverage will need to be confirmed)  09/11/2020     There are no preventive care reminders to display for this patient.  No results found for: TSH Lab Results  Component Value Date   WBC 7.5 11/17/2020   HGB 15.0 11/17/2020   HCT 46.1 11/17/2020   MCV 91.8 11/17/2020   PLT 298 11/17/2020   Lab Results  Component Value Date   NA 140 11/17/2020   K 4.4 11/17/2020   CO2 27 11/17/2020   GLUCOSE 101 (H) 11/17/2020   BUN 11 11/17/2020   CREATININE 0.67 (L) 11/17/2020   BILITOT 0.7 11/17/2020   ALKPHOS 79 05/03/2020   AST 20 11/17/2020   ALT 22 11/17/2020   PROT 6.5 11/17/2020   ALBUMIN 4.5 05/03/2020   CALCIUM 9.4 11/17/2020   ANIONGAP 9 11/04/2019   EGFR 106 11/17/2020   Lab Results  Component Value Date   CHOL 227 (H) 05/03/2020   Lab Results  Component Value Date   HDL 94 05/03/2020   Lab Results  Component Value Date   LDLCALC 118 (H) 05/03/2020   Lab Results  Component Value Date   TRIG 90 05/03/2020   Lab Results  Component Value Date   CHOLHDL 2.4 05/03/2020   No results found for: HGBA1C    Assessment & Plan:  Elevated blood pressure - Second check of blood pressure was within normal limits. Home readings are also within normal limits. No new start of medication at this time.   Problem List Items Addressed This Visit   None Visit Diagnoses     Elevated blood pressure reading    -  Primary   Needs flu shot       Relevant Orders   Flu Vaccine QUAD 6+ mos PF IM (Fluarix Quad PF) (Completed)       No orders of the defined types were placed in this encounter.   Follow-up: No follow-ups on file.    Lavell Luster, [place]

## 2020-12-04 NOTE — Progress Notes (Signed)
Medical screening examination/treatment was performed by qualified clinical staff member and as supervising physician I was immediately available for consultation/collaboration. I have reviewed documentation and agree with assessment and plan.  Nicolis Boody, DO  

## 2020-12-25 ENCOUNTER — Other Ambulatory Visit: Payer: Self-pay | Admitting: Cardiovascular Disease

## 2020-12-25 DIAGNOSIS — E782 Mixed hyperlipidemia: Secondary | ICD-10-CM

## 2020-12-28 ENCOUNTER — Other Ambulatory Visit: Payer: Self-pay | Admitting: Family Medicine

## 2020-12-28 MED ORDER — LORAZEPAM 1 MG PO TABS: 1.0000 mg | ORAL_TABLET | Freq: Three times a day (TID) | ORAL | 0 refills | Status: DC | PRN

## 2020-12-28 NOTE — Telephone Encounter (Signed)
Rx renewed

## 2020-12-28 NOTE — Telephone Encounter (Signed)
Pt informed of RX.  T. Janene Yousuf, CMA 

## 2020-12-28 NOTE — Telephone Encounter (Signed)
Ian Clark called this afternoon to schedule appt. We could not get him in until next week. He was asking if you could call in refill for Lorazepam until his appt on 10/19.

## 2021-01-03 ENCOUNTER — Other Ambulatory Visit: Payer: Self-pay

## 2021-01-03 ENCOUNTER — Encounter: Payer: Self-pay | Admitting: Cardiovascular Disease

## 2021-01-03 ENCOUNTER — Ambulatory Visit (INDEPENDENT_AMBULATORY_CARE_PROVIDER_SITE_OTHER): Payer: BC Managed Care – PPO

## 2021-01-03 ENCOUNTER — Ambulatory Visit (INDEPENDENT_AMBULATORY_CARE_PROVIDER_SITE_OTHER): Payer: BC Managed Care – PPO | Admitting: Cardiovascular Disease

## 2021-01-03 VITALS — BP 132/86 | HR 62 | Ht 71.5 in | Wt 190.8 lb

## 2021-01-03 DIAGNOSIS — E782 Mixed hyperlipidemia: Secondary | ICD-10-CM

## 2021-01-03 DIAGNOSIS — R002 Palpitations: Secondary | ICD-10-CM

## 2021-01-03 DIAGNOSIS — R0789 Other chest pain: Secondary | ICD-10-CM

## 2021-01-03 NOTE — Assessment & Plan Note (Signed)
History of palpitations which occur principally during times of anxiety and sometimes at night when he is lying down.  I am going to get a 2-week Zio patch to further evaluate.  He does drink 1/2 cup of caffeinated coffee a day.

## 2021-01-03 NOTE — Progress Notes (Unsigned)
Enrolled patient for a 14 day Zio XT  monitor to be mailed to patients home  °

## 2021-01-03 NOTE — Assessment & Plan Note (Signed)
History of atypical chest pain which led to a outpatient diagnostic coronary angiogram and I performed radially 12/21/2019 which was entirely normal including normal LV function.  No longer has chest pain.  He thinks this was related to anxiety.

## 2021-01-03 NOTE — Progress Notes (Signed)
01/03/2021 Ian Clark   02-Dec-1960  [id number]  Primary Physician Luetta Nutting, DO Primary Cardiologist: Lorretta Harp MD [name], [place]  HPI:  Ian Clark is a 60 y.o.  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  He was referred by the emergency room in [place], part of [facility], because of recently evaluated chest pain.  I last saw him in the office 12/17/2019.  He has no cardiac risk factors.  He does drink several beers and 1-2 bourbons a night.  He has a history of a hiatal hernia as well as GERD on antireflux medications.  He had a right inguinal hernia repair done laparoscopically at [facility] proxy 5 weeks ago.  There is no family history of heart disease.  Never had a heart attack or stroke.  He developed chest pressure last Friday which waxed and waned over the weekend.  He has chronic left upper extremity discomfort.  He was evaluated in the emergency room in [place] where his enzymes were negative and his EKG showed no acute changes.  They had a low suspicion that this was ischemically mediated.  He continues to have off-and-on chest pain.  Because of his chest pain I elected to perform outpatient diagnostic coronary angiography 12/21/2019 via the right radial approach which was entirely normal including normal LV function.  He no longer has chest pain.  He does complain of palpitations at night when he is lying down and occasionally during times of stress.  He drinks 1/2 cup of caffeinated coffee a day.   Current Meds  Medication Sig   aspirin EC 81 MG tablet Take 1 tablet (81 mg total) by mouth daily. Swallow whole.   b complex vitamins tablet Take 1 tablet by mouth daily.   BL GLUCOSAMINE-CHONDROITIN PO Take by mouth.   LORazepam (ATIVAN) 1 MG tablet Take 1 tablet (1 mg total) by mouth every 8 (eight) hours as needed for anxiety (Take 1 hour before flight).   Multiple  Vitamins-Minerals (MULTIVITAMIN ADULTS PO) Take 1 tablet by mouth daily.   Omega-3 Fatty Acids (FISH OIL) 1000 MG CAPS Take 1 capsule by mouth daily.   pantoprazole (PROTONIX) 40 MG tablet Take 40 mg by mouth daily.   rosuvastatin (CRESTOR) 5 MG tablet Take 1 tablet (5 mg total) by mouth every other day.   sildenafil (VIAGRA) 100 MG tablet Take by mouth.     Allergies  Allergen Reactions   Morphine And Related Other (See Comments)    Severe Hypotension    Social History   Socioeconomic History   Marital status: Married    Spouse name: Not on file   Number of children: Not on file   Years of education: Not on file   Highest education level: Not on file  Occupational History   Not on file  Tobacco Use   Smoking status: Former   Smokeless tobacco: Never  Vaping Use   Vaping Use: Never used  Substance and Sexual Activity   Alcohol use: Not Currently    Alcohol/week: 14.0 standard drinks    Types: 14 Standard drinks or equivalent per week   Drug use: Never   Sexual activity: Yes  Other Topics Concern   Not on file  Social History Narrative   Not on file   Social Determinants of Health   Financial Resource Strain: Not on file  Food Insecurity: Not on file  Transportation  Needs: Not on file  Physical Activity: Not on file  Stress: Not on file  Social Connections: Not on file  Intimate Partner Violence: Not on file     Review of Systems: General: negative for chills, fever, night sweats or weight changes.  Cardiovascular: negative for chest pain, dyspnea on exertion, edema, orthopnea, palpitations, paroxysmal nocturnal dyspnea or shortness of breath Dermatological: negative for rash Respiratory: negative for cough or wheezing Urologic: negative for hematuria Abdominal: negative for nausea, vomiting, diarrhea, bright red blood per rectum, melena, or hematemesis Neurologic: negative for visual changes, syncope, or dizziness All other systems reviewed and are otherwise  negative except as noted above.    Blood pressure 132/86, pulse 62, height 5' 11.5" (1.816 m), weight 190 lb 12.8 oz (86.5 kg), SpO2 99 %.  General appearance: alert and no distress Neck: no adenopathy, no carotid bruit, no JVD, supple, symmetrical, trachea midline, and thyroid not enlarged, symmetric, no tenderness/mass/nodules Lungs: clear to auscultation bilaterally Heart: regular rate and rhythm, S1, S2 normal, no murmur, click, rub or gallop Extremities: extremities normal, atraumatic, no cyanosis or edema Pulses: 2+ and symmetric Skin: Skin color, texture, turgor normal. No rashes or lesions Neurologic: Grossly normal  EKG sinus rhythm at 62 without ST or T wave changes.  I personally reviewed this EKG.  ASSESSMENT AND PLAN:   Atypical chest pain History of atypical chest pain which led to a outpatient diagnostic coronary angiogram and I performed radially 12/21/2019 which was entirely normal including normal LV function.  No longer has chest pain.  He thinks this was related to anxiety.  Hyperlipidemia History of mild hyperlipidemia with lipid profile performed 05/03/2020 revealing total cholesterol 227, LDL of 118 and HDL of 94.  Given his normal coronary arteries and high HDL, I do not feel compelled to increase his rosuvastatin at this time.  Palpitations History of palpitations which occur principally during times of anxiety and sometimes at night when he is lying down.  I am going to get a 2-week Zio patch to further evaluate.  He does drink 1/2 cup of caffeinated coffee a day.     Lorretta Harp MD FACP,FACC,FAHA, [facility] 01/03/2021 10:17 AM

## 2021-01-03 NOTE — Assessment & Plan Note (Signed)
History of mild hyperlipidemia with lipid profile performed 05/03/2020 revealing total cholesterol 227, LDL of 118 and HDL of 94.  Given his normal coronary arteries and high HDL, I do not feel compelled to increase his rosuvastatin at this time.

## 2021-01-03 NOTE — Patient Instructions (Signed)
Medication Instructions:  Your physician recommends that you continue on your current medications as directed. Please refer to the Current Medication list given to you today.  *If you need a refill on your cardiac medications before your next appointment, please call your pharmacy*   Testing/Procedures: [place] Monitor Instructions  Your physician has requested you wear a ZIO patch monitor for 14 days.  This is a single patch monitor. [employer] supplies one patch monitor per enrollment. Additional stickers are not available. Please do not apply patch if you will be having a Nuclear Stress Test,  Echocardiogram, Cardiac CT, MRI, or Chest Xray during the period you would be wearing the  monitor. The patch cannot be worn during these tests. You cannot remove and re-apply the  ZIO XT patch monitor.  Your ZIO patch monitor will be mailed 3 day USPS to your address on file. It may take 3-5 days  to receive your monitor after you have been enrolled.  Once you have received your monitor, please review the enclosed instructions. Your monitor  has already been registered assigning a specific monitor serial # to you.  Billing and Patient Assistance Program Information  We have supplied [employer] with any of your insurance information on file for billing purposes. [employer] offers a sliding scale Patient Assistance Program for patients that do not have  insurance, or whose insurance does not completely cover the cost of the ZIO monitor.  You must apply for the Patient Assistance Program to qualify for this discounted rate.  To apply, please call [employer] at [phone], select option 4, select option 2, ask to apply for  Patient Assistance Program. Theodore Demark will ask your household income, and how many people  are in your household. They will quote your out-of-pocket cost based on that information.  [employer] will also be able to set up a 27-month, interest-free payment plan if needed.  Applying  the monitor   Shave hair from upper left chest.  Hold abrader disc by orange tab. Rub abrader in 40 strokes over the upper left chest as  indicated in your monitor instructions.  Clean area with 4 enclosed alcohol pads. Let dry.  Apply patch as indicated in monitor instructions. Patch will be placed under collarbone on left  side of chest with arrow pointing upward.  Rub patch adhesive wings for 2 minutes. Remove white label marked "1". Remove the white  label marked "2". Rub patch adhesive wings for 2 additional minutes.  While looking in a mirror, press and release button in center of patch. A small green light will  flash 3-4 times. This will be your only indicator that the monitor has been turned on.   Do not shower for the first 24 hours. You may shower after the first 24 hours.   Press the button if you feel a symptom. You will hear a small click. Record Date, Time and  Symptom in the Patient Logbook.  When you are ready to remove the patch, follow instructions on the last 2 pages of Patient  Logbook. Stick patch monitor onto the last page of Patient Logbook.  Place Patient Logbook in the blue and white box. Use locking tab on box and tape box closed  securely. The blue and white box has prepaid postage on it. Please place it in the mailbox as  soon as possible. Your physician should have your test results approximately 7 days after the  monitor has been mailed back to [facility].  Call [employer]  Customer Care at [phone] if you have questions regarding  your ZIO XT patch monitor. Call them immediately if you see an orange light blinking on your  monitor.  If your monitor falls off in less than 4 days, contact our Monitor department at [phone].  If your monitor becomes loose or falls off after 4 days call [employer] at [phone] for  suggestions on securing your monitor    Follow-Up: At [facility], you and your health needs are our priority.  As part of  our continuing mission to provide you with exceptional heart care, we have created designated Provider Care Teams.  These Care Teams include your primary Cardiologist (physician) and Advanced Practice Providers (APPs -  Physician Assistants and Nurse Practitioners) who all work together to provide you with the care you need, when you need it.  We recommend signing up for the patient portal called "MyChart".  Sign up information is provided on this After Visit Summary.  MyChart is used to connect with patients for Virtual Visits (Telemedicine).  Patients are able to view lab/test results, encounter notes, upcoming appointments, etc.  Non-urgent messages can be sent to your provider as well.   To learn more about what you can do with MyChart, go to [email or web address].    Your next appointment:   We will see you on an as needed basis.  Provider:   Quay Burow, MD

## 2021-01-04 ENCOUNTER — Encounter: Payer: Self-pay | Admitting: Family Medicine

## 2021-01-04 ENCOUNTER — Ambulatory Visit (INDEPENDENT_AMBULATORY_CARE_PROVIDER_SITE_OTHER): Payer: BC Managed Care – PPO | Admitting: Family Medicine

## 2021-01-04 VITALS — BP 147/83 | HR 82 | Temp 98.0°F | Ht 71.0 in | Wt 196.0 lb

## 2021-01-04 DIAGNOSIS — F411 Generalized anxiety disorder: Secondary | ICD-10-CM | POA: Diagnosis not present

## 2021-01-04 DIAGNOSIS — R1010 Upper abdominal pain, unspecified: Secondary | ICD-10-CM | POA: Diagnosis not present

## 2021-01-04 MED ORDER — HYDROXYZINE PAMOATE 25 MG PO CAPS: 25.0000 mg | ORAL_CAPSULE | Freq: Three times a day (TID) | ORAL | 2 refills | Status: DC | PRN

## 2021-01-04 NOTE — Assessment & Plan Note (Signed)
Continues to have abdominal pain.  He remains concerned about continual need for this.  Referral placed to gastroenterology as he is wanting to establish within the [facility] system.

## 2021-01-04 NOTE — Progress Notes (Signed)
Ian Clark - 61 y.o. male MRN [id number]  Date of birth: [birth year]  Subjective Chief Complaint  Patient presents with   Anxiety   Abdominal Pain    HPI Ian Clark is a 60 year old male here today for follow-up of anxiety.  He reports that he continues to have symptoms of anxiety including increased worry and ruminating thoughts especially at night.  He has had palpitations as well which is being worked up by cardiology.  He has been prescribed lorazepam previously to help with anxiety related to flying.  This is helpful for his generalized anxiety as well however he is concerned about dependency related to this.  He does not want to start SSRI due to need to take daily.  He would prefer to take something as needed.  He also continues to have upper abdominal pain.  He is seeing gastroenterology at [facility] and had endoscopy scheduled but this is not until February of next year.  He would like to establish with a provider within the [facility] system.  ROS:  A comprehensive ROS was completed and negative except as noted per HPI  Allergies  Allergen Reactions   Morphine And Related Other (See Comments)    Severe Hypotension    Past Medical History:  Diagnosis Date   Hx of colonic polyps    PONV (postoperative nausea and vomiting)     Past Surgical History:  Procedure Laterality Date   COLONOSCOPY     EYE SURGERY Left    laser retinal surgery   HERNIA REPAIR     x2   INGUINAL HERNIA REPAIR Left 11/09/2019   Procedure: LAPAROSCOPIC LEFT INGUINAL HERNIA WITH MESH;  Surgeon: Jesusita Oka, MD;  Location: [facility];  Service: General;  Laterality: Left;   LEFT HEART CATH AND CORONARY ANGIOGRAPHY N/A 12/21/2019   Procedure: LEFT HEART CATH AND CORONARY ANGIOGRAPHY;  Surgeon: Lorretta Harp, MD;  Location: [facility];  Service: Cardiovascular;  Laterality: N/A;   RESECTION DISTAL CLAVICAL     metal there   SHOULDER SURGERY Left    TONSILLECTOMY     Ulnar nerve surgery      WISDOM TOOTH EXTRACTION      Social History   Socioeconomic History   Marital status: Married    Spouse name: Not on file   Number of children: Not on file   Years of education: Not on file   Highest education level: Not on file  Occupational History   Not on file  Tobacco Use   Smoking status: Former   Smokeless tobacco: Never  Vaping Use   Vaping Use: Never used  Substance and Sexual Activity   Alcohol use: Not Currently    Alcohol/week: 14.0 standard drinks    Types: 14 Standard drinks or equivalent per week   Drug use: Never   Sexual activity: Yes  Other Topics Concern   Not on file  Social History Narrative   Not on file   Social Determinants of Health   Financial Resource Strain: Not on file  Food Insecurity: Not on file  Transportation Needs: Not on file  Physical Activity: Not on file  Stress: Not on file  Social Connections: Not on file    Family History  Problem Relation Age of Onset   Hypertension Mother    Hypertension Father     Health Maintenance  Topic Date Due   Pneumococcal Vaccine 59-23 Years old (1 - PCV) Never done   HIV Screening  Never done  Hepatitis C Screening  Never done   COVID-19 Vaccine (4 - Booster for Moderna series) 07/23/2020   COLONOSCOPY (Pts 45-51yrs Insurance coverage will need to be confirmed)  09/11/2020   TETANUS/TDAP  11/15/2030   INFLUENZA VACCINE  Completed   Zoster Vaccines- Shingrix  Completed   HPV VACCINES  Aged Out     ----------------------------------------------------------------------------------------------------------------------------------------------------------------------------------------------------------------- Physical Exam BP (!) 147/83 (BP Location: Left Arm, Patient Position: Sitting, Cuff Size: Normal)   Pulse 82   Temp 98 F (36.7 C)   Ht 5\' 11"  (1.803 m)   Wt 196 lb (88.9 kg)   SpO2 99%   BMI 27.34 kg/m   Physical Exam Constitutional:      Appearance: He is well-developed.   HENT:     Head: Normocephalic and atraumatic.  Eyes:     General: No scleral icterus. Cardiovascular:     Rate and Rhythm: Normal rate and regular rhythm.  Pulmonary:     Effort: Pulmonary effort is normal.     Breath sounds: Normal breath sounds.  Abdominal:     General: Abdomen is flat. There is no distension.     Palpations: Abdomen is soft.  Musculoskeletal:     Cervical back: Neck supple.  Skin:    General: Skin is warm and dry.  Neurological:     General: No focal deficit present.     Mental Status: He is alert.  Psychiatric:        Mood and Affect: Mood normal.        Behavior: Behavior normal.    ------------------------------------------------------------------------------------------------------------------------------------------------------------------------------------------------------------------- Assessment and Plan  Anxiety state We discussed alternatives for management of anxiety.  He does not feel like he needs to see a therapist at this time.  He also does not want to start an SSRI for management of his anxiety.  We will add hydroxyzine 25 mg every 8 hours as needed.  We reviewed potential side effects with this.  He may utilize lorazepam if needed for severe anxiety  Abdominal pain Continues to have abdominal pain.  He remains concerned about continual need for this.  Referral placed to gastroenterology as he is wanting to establish within the [facility] system.   Meds ordered this encounter  Medications   hydrOXYzine (VISTARIL) 25 MG capsule    Sig: Take 1 capsule (25 mg total) by mouth every 8 (eight) hours as needed for anxiety.    Dispense:  30 capsule    Refill:  2    Return in about 8 weeks (around 03/01/2021) for GAD.    This visit occurred during the SARS-CoV-2 public health emergency.  Safety protocols were in place, including screening questions prior to the visit, additional usage of staff PPE, and extensive cleaning of exam room while  observing appropriate contact time as indicated for disinfecting solutions.

## 2021-01-04 NOTE — Assessment & Plan Note (Signed)
We discussed alternatives for management of anxiety.  He does not feel like he needs to see a therapist at this time.  He also does not want to start an SSRI for management of his anxiety.  We will add hydroxyzine 25 mg every 8 hours as needed.  We reviewed potential side effects with this.  He may utilize lorazepam if needed for severe anxiety

## 2021-01-04 NOTE — Patient Instructions (Signed)
Try hydroxyzine as needed for anxiety.  Follow up in 8 weeks.

## 2021-01-09 DIAGNOSIS — R002 Palpitations: Secondary | ICD-10-CM | POA: Diagnosis not present

## 2021-01-23 ENCOUNTER — Telehealth: Payer: Self-pay | Admitting: Gastroenterology

## 2021-01-23 NOTE — Telephone Encounter (Signed)
Hi Dr. Fuller Plan (Doc of Day for 01/11/21-PM)  This patient is requesting a transfer of care from [facility].  I am sending you his records for review.  Please advise.

## 2021-01-24 NOTE — Telephone Encounter (Signed)
OK to switch to [facility] however I am unable to accommodate his transfer at this time. Please see if Drs. Candis Schatz, Dr. Lorenso Courier or another provider with more availability can accommodate.

## 2021-01-26 NOTE — Telephone Encounter (Signed)
Hi Dr. Candis Schatz,  Please see Dr. Lynne Leader telephone note below. Would you be willing to see this gentleman? I am sending you his records for your review.  Thank you.

## 2021-01-27 ENCOUNTER — Encounter: Payer: Self-pay | Admitting: Gastroenterology

## 2021-01-27 NOTE — Telephone Encounter (Signed)
Hi Dr. Candis Schatz,  I put them in the stack for [facility] to give to you.  You should be getting them shortly.  Thank you.

## 2021-02-21 ENCOUNTER — Encounter: Payer: Self-pay | Admitting: Gastroenterology

## 2021-02-21 ENCOUNTER — Ambulatory Visit (INDEPENDENT_AMBULATORY_CARE_PROVIDER_SITE_OTHER): Payer: BC Managed Care – PPO | Admitting: Gastroenterology

## 2021-02-21 VITALS — BP 132/88 | HR 84 | Ht 72.0 in | Wt 186.0 lb

## 2021-02-21 DIAGNOSIS — R0789 Other chest pain: Secondary | ICD-10-CM

## 2021-02-21 DIAGNOSIS — K2100 Gastro-esophageal reflux disease with esophagitis, without bleeding: Secondary | ICD-10-CM | POA: Diagnosis not present

## 2021-02-21 DIAGNOSIS — K589 Irritable bowel syndrome without diarrhea: Secondary | ICD-10-CM

## 2021-02-21 DIAGNOSIS — F411 Generalized anxiety disorder: Secondary | ICD-10-CM

## 2021-02-21 MED ORDER — DICYCLOMINE HCL 20 MG PO TABS: 20.0000 mg | ORAL_TABLET | Freq: Four times a day (QID) | ORAL | 3 refills | Status: DC

## 2021-02-21 NOTE — Patient Instructions (Addendum)
If you are age 60 or older, your body mass index should be between 23-30. Your Body mass index is 25.23 kg/m. If this is out of the aforementioned range listed, please consider follow up with your Primary Care Provider.  If you are age 15 or younger, your body mass index should be between 19-25. Your Body mass index is 25.23 kg/m. If this is out of the aformentioned range listed, please consider follow up with your Primary Care Provider.   Follow up as needed.  The [facility] providers would like to encourage you to use [facility] to communicate with providers for non-urgent requests or questions.  Due to long hold times on the telephone, sending your provider a message by [facility] may be a faster and more efficient way to get a response.  Please allow 48 business hours for a response.  Please remember that this is for non-urgent requests.   It was a pleasure to see you today!  Thank you for trusting me with your gastrointestinal care!   Scott E.Candis Schatz, MD

## 2021-02-21 NOTE — Progress Notes (Addendum)
HPI : Ian Clark is a very pleasant 60 year old male with a history of anxiety who is referred to [place] by Dr. Luetta Nutting for further management of chronic GERD symptoms and IBS.  The patient was previously followed by [facility] but he wished to switch to a [facility]-based GI group to consolidate his medical care.  The patient has a long history of typical GERD symptoms as well as atypical chest pain episodes consisting of crushing pressure-like chest pain.  Sometimes the episodes are severe and has made him think he was having a heart attack.  He has found that chewing gum sometimes helps relieve these episodes.  He has occasional acid regurgitation which is well controlled with Protonix.   He reports excessive belching.  Denies dysphagia.  No nausea/vomiting. He has had these symptoms for several years now.   He underwent an EGD in 2019 which showed LA Grade esophagitis and a 3 cm hiatal hernia He states that an upper GI series also demonstrated reflux. He would like to stop taking medications for GERD if possible. He also has chronic left lower abdominal pain.  This pain is not severe and is more of a nuisance.  It is noticed more with certain activities and is not usually meal related or associated with bowel movements.  His bowel movements have been regular as of late. He had a CT scan last year to evaluate abdominal pain which showed small hernias in the L inguinal ring and umbilicus.  A duodenal diverticulum was noted.  A CT in 2019 was also unremarkable.  He was prescribed Elavil for his abdominal pain previously but experienced significant side effects (jaw clenching) that he only took it once. His last colonoscopy was in 2017 and a hyperplastic polyp was removed.  He had a colonoscopy 5 years prior to that in which an adenoma was removed (details of size, number not available).     Past Medical History:  Diagnosis Date   Hx of colonic polyps    PONV (postoperative nausea and vomiting)       Past Surgical History:  Procedure Laterality Date   COLONOSCOPY     EYE SURGERY Left    laser retinal surgery   HERNIA REPAIR     x2   INGUINAL HERNIA REPAIR Left 11/09/2019   Procedure: LAPAROSCOPIC LEFT INGUINAL HERNIA WITH MESH;  Surgeon: Jesusita Oka, MD;  Location: [place];  Service: General;  Laterality: Left;   LEFT HEART CATH AND CORONARY ANGIOGRAPHY N/A 12/21/2019   Procedure: LEFT HEART CATH AND CORONARY ANGIOGRAPHY;  Surgeon: Lorretta Harp, MD;  Location: [place] [facility];  Service: Cardiovascular;  Laterality: N/A;   RESECTION DISTAL CLAVICAL     metal there   SHOULDER SURGERY Left    TONSILLECTOMY     Ulnar nerve surgery     WISDOM TOOTH EXTRACTION     Family History  Problem Relation Age of Onset   Hypertension Mother    Hypertension Father    Colon cancer Father    Stomach cancer Neg Hx    Esophageal cancer Neg Hx    Pancreatic cancer Neg Hx    Social History   Tobacco Use   Smoking status: Former   Smokeless tobacco: Never  Vaping Use   Vaping Use: Never used  Substance Use Topics   Alcohol use: Yes    Alcohol/week: 14.0 standard drinks    Types: 14 Standard drinks or equivalent per week    Comment:  Occ.   Drug use: Never   Current Outpatient Medications  Medication Sig Dispense Refill   aspirin EC 81 MG tablet Take 1 tablet (81 mg total) by mouth daily. Swallow whole. 90 tablet 3   b complex vitamins tablet Take 1 tablet by mouth daily.     BL GLUCOSAMINE-CHONDROITIN PO Take by mouth.     hydrOXYzine (VISTARIL) 25 MG capsule Take 1 capsule (25 mg total) by mouth every 8 (eight) hours as needed for anxiety. 30 capsule 2   LORazepam (ATIVAN) 1 MG tablet Take 1 tablet (1 mg total) by mouth every 8 (eight) hours as needed for anxiety (Take 1 hour before flight). 15 tablet 0   Multiple Vitamins-Minerals (MULTIVITAMIN ADULTS PO) Take 1 tablet by mouth daily.     Omega-3 Fatty Acids (FISH OIL) 1000 MG CAPS Take 1 capsule by mouth daily.      pantoprazole (PROTONIX) 40 MG tablet Take 40 mg by mouth daily.     sildenafil (VIAGRA) 100 MG tablet Take by mouth.     rosuvastatin (CRESTOR) 5 MG tablet Take 1 tablet (5 mg total) by mouth every other day. 45 tablet 3   No current facility-administered medications for this visit.   Allergies  Allergen Reactions   Morphine And Related Other (See Comments)    Severe Hypotension     Review of Systems: All systems reviewed and negative except where noted in HPI.    LONG TERM MONITOR (3-14 DAYS)  Result Date: 01/29/2021 Patch Wear Time:  13 days and 23 hours (2022-10-24T07:42:38-0400 to 2022-11-07T06:15:11-498) Patient had a min HR of 39 bpm, max HR of 214 bpm, and avg HR of 69 bpm. Predominant underlying rhythm was Sinus Rhythm. 46 Supraventricular Tachycardia runs occurred, the run with the fastest interval lasting 4 beats with a max rate of 214 bpm, the longest lasting 19 beats with an avg rate of 119 bpm. Some episodes of Supraventricular Tachycardia may be possible Atrial Tachycardia with variable block. Supraventricular Tachycardia was detected within +/- 45 seconds of symptomatic patient event(s). Isolated SVEs were rare (<1.0%), SVE Couplets were rare (<1.0%), and SVE Triplets were rare (<1.0%). Isolated VEs were rare (<1.0%), VE Couplets were rare (<1.0%), and no VE Triplets were present. 1. SR/SB/ST 2. Occasional PACs 3. Short runs of SVT   Narrative & Impression CLINICAL DATA:  Diffuse abdominal pain for approximately 3 months.   EXAM: CT ABDOMEN AND PELVIS WITH CONTRAST   TECHNIQUE: Multidetector CT imaging of the abdomen and pelvis was performed using the standard protocol following bolus administration of intravenous contrast.   CONTRAST:  172mL ISOVUE-300 IOPAMIDOL (ISOVUE-300) INJECTION 61%   COMPARISON:  None.   FINDINGS: Lower Chest: No acute findings.   Hepatobiliary: No hepatic masses identified. Several small hepatic cysts are noted. Gallbladder is  unremarkable.   Pancreas:  No mass or inflammatory changes.   Spleen: Within normal limits in size and appearance.   Adrenals/Urinary Tract: No masses identified. No evidence of hydronephrosis.   Stomach/Bowel: No evidence of obstruction, inflammatory process or abnormal fluid collections. Normal appendix visualized.   Vascular/Lymphatic: No pathologically enlarged lymph nodes. No abdominal aortic aneurysm. Aortic atherosclerosis.   Reproductive:  No mass or other significant abnormality.   Other:  None.   Musculoskeletal:  No suspicious bone lesions identified.   IMPRESSION: No acute findings or other significant abnormality.     Electronically Signed   By: Earle Gell M.D.   On: 09/30/2017 14:10  CLINICAL DATA:  Epigastric pain for  2 months   EXAM: ULTRASOUND ABDOMEN LIMITED RIGHT UPPER QUADRANT   COMPARISON:  CT abdomen pelvis of 09/30/2017   FINDINGS: Gallbladder:   Gallbladder is visualized and no gallstones are noted. There is no pain over the gallbladder with compression.   Common bile duct:   Diameter: The common bile duct measures 3.0 mm which is normal.   Liver:   The parenchyma of the liver is somewhat inhomogeneous and slightly echogenic and mild hepatic steatosis cannot be excluded. Correlate with LFTs. A small cyst is noted in the right lobe of 1.5 cm in diameter. The portal vein is patent with normal direction of flow.   IMPRESSION: 1. No gallstones.  No ductal dilatation. 2. Somewhat echogenic and inhomogeneous liver parenchyma may indicate mild hepatic steatosis. Correlate with LFTs.     Electronically Signed   By: Ivar Drape M.D.   On: 11/21/2017 15:47  Narrative & Impression CLINICAL DATA:  Lower abdominal/pelvic pain   EXAM: CT ABDOMEN AND PELVIS WITH CONTRAST   TECHNIQUE: Multidetector CT imaging of the abdomen and pelvis was performed using the standard protocol following bolus administration of intravenous contrast.    CONTRAST:  165mL ISOVUE-300 IOPAMIDOL (ISOVUE-300) INJECTION 61%   COMPARISON:  September 30, 2017   FINDINGS: Lower chest: There is slight bibasilar lung scarring. There is no lung base edema or airspace opacity.   Hepatobiliary: There are stable scattered cysts throughout the liver, largest in the anterior segment right lobe measuring 1.8 x 1.7 cm. There is hepatic steatosis. No new liver lesions evident. Gallbladder wall is not appreciably thickened. There is no biliary duct dilatation.   Pancreas: There is no pancreatic mass or inflammatory focus.   Spleen: No splenic lesions are evident.   Adrenals/Urinary Tract: Adrenals bilaterally appear normal. Kidneys bilaterally show no evident mass or hydronephrosis on either side. There is no appreciable renal or ureteral calculus on either side. Urinary bladder is midline with wall thickness within normal limits.   Stomach/Bowel: There is no appreciable bowel wall or mesenteric thickening. There are occasional sigmoid diverticula without diverticulitis. There is a focal diverticulum arising at the junction of the second and third portions of the duodenum measuring 2.7 x 1.8 cm without complicating features. There is no evident bowel obstruction. The terminal ileum appears normal. There is no demonstrable free air or portal venous air.   Vascular/Lymphatic: No abdominal aortic aneurysm. There are occasional foci of aortic and iliac artery atherosclerosis. Major venous structures appear patent. There is no evident adenopathy in the abdomen or pelvis by size criteria. There are a few subcentimeter mesenteric lymph nodes, considered nonspecific.   Reproductive: There are a few prostatic calculi. Prostate and seminal vesicles are normal in size and contour. No evident pelvic mass.   Other: Appendix appears normal. No abscess or ascites evident in the abdomen or pelvis. There is fat in the left inguinal ring. No bowel containing hernia  evident. There is mild fat in the umbilicus.   Musculoskeletal: There are no blastic or lytic bone lesions. There are foci of degenerative change in the lumbar spine. No intramuscular lesions are evident.   IMPRESSION: 1. Fat in the left inguinal ring. No inflammatory change in this area. No bowel containing hernia.   2. No bowel obstruction. No abscess in the abdomen or pelvis. Occasional sigmoid diverticula without diverticulitis. Focal non complicated appearing duodenal diverticulum at the junction of the second and third portions of the duodenum measuring 2.7 x 1.8 cm.   3.  Hepatic  steatosis.   4. No renal or ureteral calculus. No hydronephrosis. Urinary bladder wall thickness normal.   5.  Aortic Atherosclerosis (ICD10-I70.0), mild.     Electronically Signed   By: Lowella Grip III M.D.   On: 09/25/2019 10:22   Physical Exam: Ht 6' (1.829 m)   Wt 186 lb (84.4 kg)   BMI 25.23 kg/m  Constitutional: Pleasant,well-developed, Caucasian male in no acute distress. HEENT: Normocephalic and atraumatic. Conjunctivae are normal. No scleral icterus. Cardiovascular: Normal rate, regular rhythm.  Pulmonary/chest: Effort normal and breath sounds normal. No wheezing, rales or rhonchi. Abdominal: Soft, nondistended, mild tenderness in lower abdomen.  No umbilical hernia appreciated. Bowel sounds active throughout. There are no masses palpable. No hepatomegaly. Extremities: no edema Neurological: Alert and oriented to person place and time. Skin: Skin is warm and dry. No rashes noted. Psychiatric: Normal mood and affect. Behavior is normal.  CBC    Component Value Date/Time   WBC 7.5 11/17/2020 0000   RBC 5.02 11/17/2020 0000   HGB 15.0 11/17/2020 0000   HGB 14.9 12/17/2019 1425   HCT 46.1 11/17/2020 0000   HCT 43.6 12/17/2019 1425   PLT 298 11/17/2020 0000   PLT 304 12/17/2019 1425   MCV 91.8 11/17/2020 0000   MCV 91 12/17/2019 1425   MCH 29.9 11/17/2020 0000   MCHC  32.5 11/17/2020 0000   RDW 12.5 11/17/2020 0000   RDW 12.4 12/17/2019 1425   LYMPHSABS 1,275 11/17/2020 0000   LYMPHSABS 1.9 12/17/2019 1425   EOSABS 143 11/17/2020 0000   EOSABS 0.2 12/17/2019 1425   BASOSABS 60 11/17/2020 0000   BASOSABS 0.1 12/17/2019 1425    CMP     Component Value Date/Time   NA 140 11/17/2020 0000   NA 142 12/17/2019 1425   K 4.4 11/17/2020 0000   CL 105 11/17/2020 0000   CO2 27 11/17/2020 0000   GLUCOSE 101 (H) 11/17/2020 0000   BUN 11 11/17/2020 0000   BUN 10 12/17/2019 1425   CREATININE 0.67 (L) 11/17/2020 0000   CALCIUM 9.4 11/17/2020 0000   PROT 6.5 11/17/2020 0000   PROT 6.9 05/03/2020 0807   ALBUMIN 4.5 05/03/2020 0807   AST 20 11/17/2020 0000   ALT 22 11/17/2020 0000   ALKPHOS 79 05/03/2020 0807   BILITOT 0.7 11/17/2020 0000   BILITOT 1.0 05/03/2020 0807   GFRNONAA 100 12/17/2019 1425   GFRAA 115 12/17/2019 1425     ASSESSMENT AND PLAN: 60 year old male with GERD and history of IBS, currently with good symptom control.  We discussed the pathophysiology of GERD and the principles of GERD management to include lifestyle modifications  such as dietary discretion (avoidance of alcohol, tobacco, caffeinated and carbonated beverages, spicy/greasy foods, citrus, peppermint/chocolate), weight loss if applicable, head of bed elevation andconsuming last meal of day within 3 hours of bedtime; pharmacologic options to include PPIs, H2RAs and OTC antacids; and finally surgical or endoscopic fundoplication.  He was educated on the significance of his hiatal hernia.  The patient may potentially be interested in hernia repair and TIF and some of the details of the TIF were discussed.  He will try to wean off Protonix by taking it every other day for 2 weeks, and take Pepcid as needed for breakthrough symptoms. For his chest pain, we discussed how this could potentially be a symptom of GERD vs esophageal spasm.  As his pain is improved with chewing gum, this may  suggest spasm as the etiology (peppermint is a  recommended therapy for esophageal spasm).  I suggested Altoids as another option to help with his spasm symptoms.  If his symptoms become more frequent, we can consider esophageal manometry vs trial of diltiazem. Regarding colon cancer screening, the patient states that he was recommended to repeat colonoscopy this year because of his history of colon polyps even though his last colonoscopy in 2017 was negative for any adenomas.  I informed him that depending on the number and size of the polyps on his initial screening colonoscopy, he may be able to go 10 years from his second colonoscopy (which would be 2027).  Will obtain colonoscopy report/pathology report from index screening colonoscopy from [facility].  GERD - Wean off Protonix as above, use Pepcid PRN - Will consider HH repair/TIF further  Atypical chest pain - Try Altoids PRN for suspected esophageal spasm  Colon cancer screening - Obtain initial colonoscopy report to better guide surveillance recommendations  Abdominal pain/IBS - Bentyl PRN   CC:  Luetta Nutting, DO   Addendum:  We received a colonoscopy report and pathology report from the patient's colonoscopy September 12, 2015 performed by Dr. Arta Silence The colonoscopy report indicates that preparation of the colon was fair and that the patient had a history of colon polyps previously in 2011.  A 6 mm polyp in the transverse colon was removed but not retrieved.  A 2 mm polyp in the rectum was removed and was a hyperplastic polyp.  Handwritten notes on the patient's pathology report indicate recommending a 5-year repeat with 2-day bowel prep. Based on these results, the patient appears due for a surveillance colonoscopy.  We will contact him and get him scheduled for a colonoscopy  Adelise Buswell E. Candis Schatz, MD [facility]

## 2021-02-24 ENCOUNTER — Encounter: Payer: Self-pay | Admitting: Gastroenterology

## 2021-03-07 ENCOUNTER — Ambulatory Visit (INDEPENDENT_AMBULATORY_CARE_PROVIDER_SITE_OTHER): Payer: BC Managed Care – PPO | Admitting: Family Medicine

## 2021-03-07 ENCOUNTER — Encounter: Payer: Self-pay | Admitting: Family Medicine

## 2021-03-07 ENCOUNTER — Other Ambulatory Visit: Payer: Self-pay

## 2021-03-07 DIAGNOSIS — R002 Palpitations: Secondary | ICD-10-CM

## 2021-03-07 DIAGNOSIS — F411 Generalized anxiety disorder: Secondary | ICD-10-CM

## 2021-03-07 NOTE — Progress Notes (Signed)
Ian Clark - 60 y.o. male MRN [id number]  Date of birth: 28-Nov-1960  Subjective Chief Complaint  Patient presents with   Follow-up    HPI Ian Clark is a 60 year old male here today for follow-up visit.  Reports overall he is doing well.  He feels that his anxiety has improved significantly and he is no longer needing medication for this.  He does continue to have some intermittent palpitations.  He did have a Holter monitor which showed occasional PACs and short runs of SVT.  He has not had chest pain associated with this.  ROS:  A comprehensive ROS was completed and negative except as noted per HPI  Allergies  Allergen Reactions   Morphine And Related Other (See Comments)    Severe Hypotension    Past Medical History:  Diagnosis Date   Hx of colonic polyps    PONV (postoperative nausea and vomiting)     Past Surgical History:  Procedure Laterality Date   COLONOSCOPY     EYE SURGERY Left    laser retinal surgery   HERNIA REPAIR     x2   INGUINAL HERNIA REPAIR Left 11/09/2019   Procedure: LAPAROSCOPIC LEFT INGUINAL HERNIA WITH MESH;  Surgeon: Jesusita Oka, MD;  Location: [place];  Service: General;  Laterality: Left;   LEFT HEART CATH AND CORONARY ANGIOGRAPHY N/A 12/21/2019   Procedure: LEFT HEART CATH AND CORONARY ANGIOGRAPHY;  Surgeon: Lorretta Harp, MD;  Location: [place] [facility];  Service: Cardiovascular;  Laterality: N/A;   RESECTION DISTAL CLAVICAL     metal there   SHOULDER SURGERY Left    TONSILLECTOMY     Ulnar nerve surgery     WISDOM TOOTH EXTRACTION      Social History   Socioeconomic History   Marital status: Married    Spouse name: Not on file   Number of children: Not on file   Years of education: Not on file   Highest education level: Not on file  Occupational History   Not on file  Tobacco Use   Smoking status: Former   Smokeless tobacco: Never  Vaping Use   Vaping Use: Never used  Substance and Sexual Activity   Alcohol use: Yes     Alcohol/week: 14.0 standard drinks    Types: 14 Standard drinks or equivalent per week    Comment: Occ.   Drug use: Never   Sexual activity: Yes  Other Topics Concern   Not on file  Social History Narrative   Not on file   Social Determinants of Health   Financial Resource Strain: Not on file  Food Insecurity: Not on file  Transportation Needs: Not on file  Physical Activity: Not on file  Stress: Not on file  Social Connections: Not on file    Family History  Problem Relation Age of Onset   Hypertension Mother    Hypertension Father    Colon cancer Father    Stomach cancer Neg Hx    Esophageal cancer Neg Hx    Pancreatic cancer Neg Hx     Health Maintenance  Topic Date Due   Pneumococcal Vaccine 31-57 Years old (1 - PCV) Never done   HIV Screening  Never done   Hepatitis C Screening  Never done   COLONOSCOPY (Pts 45-5yrs Insurance coverage will need to be confirmed)  09/11/2020   TETANUS/TDAP  11/15/2030   INFLUENZA VACCINE  Completed   COVID-19 Vaccine  Completed   Zoster Vaccines- Shingrix  Completed  HPV VACCINES  Aged Out     ----------------------------------------------------------------------------------------------------------------------------------------------------------------------------------------------------------------- Physical Exam BP (!) 149/99 (BP Location: Left Arm, Patient Position: Sitting, Cuff Size: Large)    Pulse 72    Temp (!) 97.4 F (36.3 C)    Ht 5\' 11"  (1.803 m)    Wt 190 lb (86.2 kg)    SpO2 100%    BMI 26.50 kg/m   Physical Exam Constitutional:      Appearance: Normal appearance.  Eyes:     General: No scleral icterus. Cardiovascular:     Rate and Rhythm: Normal rate and regular rhythm.  Pulmonary:     Effort: Pulmonary effort is normal.     Breath sounds: Normal breath sounds.  Musculoskeletal:     Cervical back: Neck supple.  Neurological:     General: No focal deficit present.     Mental Status: He is alert.   Psychiatric:        Mood and Affect: Mood normal.        Behavior: Behavior normal.    ------------------------------------------------------------------------------------------------------------------------------------------------------------------------------------------------------------------- Assessment and Plan  Palpitations Short runs of SVT noted on Zio patch.  Symptoms seem to be mostly related to his anxiety.  Anxiety has improved at this point.  He will continue to monitor.  Anxiety state Anxiety has improved.  He has hydroxyzine and or lorazepam as needed.   No orders of the defined types were placed in this encounter.   No follow-ups on file.    This visit occurred during the SARS-CoV-2 public health emergency.  Safety protocols were in place, including screening questions prior to the visit, additional usage of staff PPE, and extensive cleaning of exam room while observing appropriate contact time as indicated for disinfecting solutions.

## 2021-03-07 NOTE — Assessment & Plan Note (Signed)
Anxiety has improved.  He has hydroxyzine and or lorazepam as needed.

## 2021-03-07 NOTE — Assessment & Plan Note (Signed)
Short runs of SVT noted on Zio patch.  Symptoms seem to be mostly related to his anxiety.  Anxiety has improved at this point.  He will continue to monitor.

## 2021-03-16 ENCOUNTER — Other Ambulatory Visit: Payer: Self-pay | Admitting: Family Medicine

## 2021-03-16 MED ORDER — PANTOPRAZOLE SODIUM 40 MG PO TBEC: 40.0000 mg | DELAYED_RELEASE_TABLET | Freq: Every day | ORAL | 1 refills | Status: DC

## 2021-03-22 ENCOUNTER — Other Ambulatory Visit: Payer: Self-pay

## 2021-03-22 ENCOUNTER — Encounter: Payer: Self-pay | Admitting: Gastroenterology

## 2021-03-22 MED ORDER — PANTOPRAZOLE SODIUM 40 MG PO TBEC: 40.0000 mg | DELAYED_RELEASE_TABLET | Freq: Every day | ORAL | 1 refills | Status: DC

## 2021-03-28 DIAGNOSIS — D2339 Other benign neoplasm of skin of other parts of face: Secondary | ICD-10-CM | POA: Diagnosis not present

## 2021-03-28 DIAGNOSIS — D485 Neoplasm of uncertain behavior of skin: Secondary | ICD-10-CM | POA: Diagnosis not present

## 2021-03-28 DIAGNOSIS — L821 Other seborrheic keratosis: Secondary | ICD-10-CM | POA: Diagnosis not present

## 2021-03-28 DIAGNOSIS — D225 Melanocytic nevi of trunk: Secondary | ICD-10-CM | POA: Diagnosis not present

## 2021-03-28 DIAGNOSIS — D235 Other benign neoplasm of skin of trunk: Secondary | ICD-10-CM | POA: Diagnosis not present

## 2021-04-14 ENCOUNTER — Telehealth: Payer: Self-pay

## 2021-04-14 NOTE — Telephone Encounter (Signed)
-----   Message from Daryel November, MD sent at 04/11/2021  4:33 PM EST ----- Ian Clark,  I saw this patient in clinic last month.  We received his previous colonoscopy report from [facility] today, and it appears that he is due for surveillance colonoscopy.  His colonoscopy in 2017 was limited by a fair bowel prep and a polyp that was removed but not retrieved.  He was recommended to repeat colonoscopy in 5 years with a 2-day bowel prep.  Can you please get him scheduled for a routine surveillance colonoscopy with 2-day bowel prep?  Thanks, Dr. Candis Schatz

## 2021-04-14 NOTE — Telephone Encounter (Signed)
Spoke with pt. Let pt know Dr. Dayle Points recommendations. Pt verbalized understanding. Attempted to schedule colonoscopy in March but due to pt's work schedule and 2 day prep requirement pt requested that we call him back when April schedule is out. Pt stated that he would need to do colonoscopy on a Friday due to his schedule, but that the days available in March would not work. Reminder placed in epic.

## 2021-05-15 ENCOUNTER — Telehealth: Payer: Self-pay

## 2021-05-15 NOTE — Telephone Encounter (Signed)
Left message for pt to call back  °

## 2021-05-15 NOTE — Telephone Encounter (Signed)
-----   Message from Marice Potter, RN sent at 04/14/2021 11:06 AM EST ----- Regarding: schedule colonoscopy Patient needs to be scheduled for colonoscopy and previsit once April schedule is out.

## 2021-05-16 ENCOUNTER — Encounter: Payer: Self-pay | Admitting: Gastroenterology

## 2021-05-16 NOTE — Telephone Encounter (Signed)
Patient has already been scheduled

## 2021-06-20 ENCOUNTER — Ambulatory Visit (AMBULATORY_SURGERY_CENTER): Payer: BC Managed Care – PPO | Admitting: *Deleted

## 2021-06-20 ENCOUNTER — Telehealth: Payer: BC Managed Care – PPO | Admitting: *Deleted

## 2021-06-20 VITALS — Ht 72.0 in | Wt 181.0 lb

## 2021-06-20 DIAGNOSIS — Z86010 Personal history of colonic polyps: Secondary | ICD-10-CM

## 2021-06-20 MED ORDER — NA SULFATE-K SULFATE-MG SULF 17.5-3.13-1.6 GM/177ML PO SOLN: 1.0000 | ORAL | 0 refills | Status: DC

## 2021-06-20 NOTE — Telephone Encounter (Signed)
Vaughan Basta, ? ?This patient is asking if Dr.Cunningham's nurse can contact his insurance company? He is taking Pantoprazole daily but they only are paying for 90 pills per year. Thanks for your help! Shyia Fillingim PV ?

## 2021-06-20 NOTE — Telephone Encounter (Signed)
This would go to Dr. Dayle Points CMA Lynnell Grain. ?

## 2021-06-20 NOTE — Progress Notes (Signed)
Patient's pre-visit was done today over the phone with the patient. Name,DOB and address verified. Patient denies any allergies to Eggs and Soy. Patient denies any problems with anesthesia/sedation. Patient is not taking any diet pills or blood thinners. No home Oxygen. Insurance confirmed with patient. ? ?Prep instructions sent to pt's MyChart (if available) -pt is aware. Patient understands to call us back with any questions or concerns. Patient is aware of our care-partner policy.  ? ?EMMI education assigned to the patient for the procedure, sent to [facility].  ? ?The patient is COVID-19 vaccinated.   ?

## 2021-06-29 ENCOUNTER — Encounter: Payer: Self-pay | Admitting: Gastroenterology

## 2021-07-05 ENCOUNTER — Emergency Department (INDEPENDENT_AMBULATORY_CARE_PROVIDER_SITE_OTHER)
Admission: RE | Admit: 2021-07-05 | Discharge: 2021-07-05 | Disposition: A | Discharge status: Home / Self Care | Payer: BC Managed Care – PPO | Source: Ambulatory Visit

## 2021-07-05 VITALS — BP 153/92 | HR 75 | Temp 98.4°F | Resp 18 | Ht 72.0 in | Wt 179.5 lb

## 2021-07-05 DIAGNOSIS — M9902 Segmental and somatic dysfunction of thoracic region: Secondary | ICD-10-CM | POA: Diagnosis not present

## 2021-07-05 DIAGNOSIS — S134XXA Sprain of ligaments of cervical spine, initial encounter: Secondary | ICD-10-CM | POA: Diagnosis not present

## 2021-07-05 DIAGNOSIS — M62838 Other muscle spasm: Secondary | ICD-10-CM | POA: Diagnosis not present

## 2021-07-05 DIAGNOSIS — M542 Cervicalgia: Secondary | ICD-10-CM | POA: Diagnosis not present

## 2021-07-05 DIAGNOSIS — M9901 Segmental and somatic dysfunction of cervical region: Secondary | ICD-10-CM | POA: Diagnosis not present

## 2021-07-05 MED ORDER — PREDNISONE 20 MG PO TABS: 20.0000 mg | ORAL_TABLET | Freq: Two times a day (BID) | ORAL | 0 refills | Status: DC

## 2021-07-05 MED ORDER — METHOCARBAMOL 750 MG PO TABS: 750.0000 mg | ORAL_TABLET | Freq: Four times a day (QID) | ORAL | 0 refills | Status: DC | PRN

## 2021-07-05 NOTE — Discharge Instructions (Addendum)
Stop ibuprofen ?Take tylenol if needed 1000 mg up to 3 times a day ?Take the muscle relaxer as needed.  May cause drowsiness ?Take the prednisone 40 mg a day.  May take both tabs at one time each day.  Stop when you feel better ?Ice or heat to area ?

## 2021-07-05 NOTE — ED Triage Notes (Signed)
X2 days ? ?Pt states that he fell off of a tractor. Pt states that he has some neck and back pain. X2 days ?

## 2021-07-05 NOTE — ED Provider Notes (Signed)
?[facility] ? ? ? ?CSN: [id number] ?Arrival date & time: 07/05/21  1137 ? ? ?  ? ?History   ?Chief Complaint ?Chief Complaint  ?Patient presents with  ? Back Pain  ?  Neck and back pain - Entered by patient  ? ? ?HPI ?Ian Clark is a 61 y.o. male.  ? ?HPI ? ?Patient had a fall a couple of days ago.  He was in a barn up on several bales of hay and fell over onto his right shoulder.  He woke up the next morning with some stiffness in his right neck.  This is persisted.  He had a lot of difficulty sleeping last night. ?Normally, patient states that he would just take ibuprofen.  He is unable to do that right now because he is scheduled for a colonoscopy in 2 days.  He was told not to take any ibuprofen in the week prior to his colonoscopy. ?He did not hit his head.  No headache.  No numbness or weakness in the arms. ? ?Past Medical History:  ?Diagnosis Date  ? GERD (gastroesophageal reflux disease)   ? Hx of colonic polyps   ? Hyperlipidemia   ? PONV (postoperative nausea and vomiting)   ? ? ?Patient Active Problem List  ? Diagnosis Date Noted  ? Anxiety state 01/04/2021  ? Palpitations 01/03/2021  ? Well adult exam 11/17/2020  ? Gastroesophageal reflux disease 09/26/2020  ? Abdominal pain 09/26/2020  ? Opacity of lung on imaging study 09/26/2020  ? Hyperlipidemia 01/05/2020  ? Atypical chest pain 12/17/2019  ? Hiatal hernia 05/18/2019  ? S/P cubital tunnel release 04/10/2018  ? ? ?Past Surgical History:  ?Procedure Laterality Date  ? COLONOSCOPY  2017  ? prep fair-hyperplastic polyps  ? EYE SURGERY Left   ? laser retinal surgery  ? HERNIA REPAIR    ? x2  ? INGUINAL HERNIA REPAIR Left 11/09/2019  ? Procedure: LAPAROSCOPIC LEFT INGUINAL HERNIA WITH MESH;  Surgeon: Jesusita Oka, MD;  Location: [facility];  Service: General;  Laterality: Left;  ? LEFT HEART CATH AND CORONARY ANGIOGRAPHY N/A 12/21/2019  ? Procedure: LEFT HEART CATH AND CORONARY ANGIOGRAPHY;  Surgeon: Lorretta Harp, MD;  Location: [facility];  Service: Cardiovascular;  Laterality: N/A;  ? RESECTION DISTAL CLAVICAL    ? metal there  ? SHOULDER SURGERY Left   ? TONSILLECTOMY    ? Ulnar nerve surgery    ? UPPER GASTROINTESTINAL ENDOSCOPY  2019  ? WISDOM TOOTH EXTRACTION    ? ? ? ? ? ?Home Medications   ? ?Prior to Admission medications   ?Medication Sig Start Date End Date Taking? Authorizing Provider  ?aspirin EC 81 MG tablet Take 1 tablet (81 mg total) by mouth daily. Swallow whole. 12/17/19  Yes Lorretta Harp, MD  ?b complex vitamins tablet Take 1 tablet by mouth daily.   Yes [provider]  ?BL GLUCOSAMINE-CHONDROITIN PO Take by mouth. 10/18/15  Yes [provider]  ?LORazepam (ATIVAN) 1 MG tablet    Yes [provider]  ?methocarbamol (ROBAXIN-750) 750 MG tablet Take 1 tablet (750 mg total) by mouth every 6 (six) hours as needed for muscle spasms. 07/05/21  Yes Raylene Everts, MD  ?Multiple Vitamins-Minerals (MULTIVITAMIN ADULTS PO) Take 1 tablet by mouth daily. 10/18/15  Yes [provider]  ?Na Sulfate-K Sulfate-Mg Sulf 17.5-3.13-1.6 GM/177ML SOLN Take 1 kit by mouth as directed. May use generic Suprep 06/20/21  Yes Daryel November,  MD  ?Omega-3 Fatty Acids (FISH OIL) 1000 MG CAPS Take 1 capsule by mouth daily. 10/18/15  Yes [provider]  ?pantoprazole (PROTONIX) 40 MG tablet Take 1 tablet (40 mg total) by mouth daily. 03/22/21  Yes Daryel November, MD  ?predniSONE (DELTASONE) 20 MG tablet Take 1 tablet (20 mg total) by mouth 2 (two) times daily with a meal. 07/05/21  Yes Raylene Everts, MD  ?rosuvastatin (CRESTOR) 5 MG tablet Take 1 tablet (5 mg total) by mouth every other day. 11/17/20 06/18/23 Yes Luetta Nutting, DO  ?sildenafil (VIAGRA) 100 MG tablet Take by mouth. 05/30/20  Yes [provider]  ? ? ?Family History ?Family History  ?Problem Relation Age of Onset  ? Hypertension Mother   ? Hypertension Father   ? Stomach cancer Neg Hx   ? Esophageal cancer Neg Hx   ?  Pancreatic cancer Neg Hx   ? Colon cancer Neg Hx   ? ? ?Social History ?Social History  ? ?Tobacco Use  ? Smoking status: Former  ? Smokeless tobacco: Never  ?Vaping Use  ? Vaping Use: Never used  ?Substance Use Topics  ? Alcohol use: Yes  ?  Alcohol/week: 14.0 standard drinks  ?  Types: 14 Standard drinks or equivalent per week  ? Drug use: Never  ? ? ? ?Allergies   ?Morphine and related ? ? ?Review of Systems ?Review of Systems ?See HPI ? ?Physical Exam ?Triage Vital Signs ?ED Triage Vitals  ?Enc Vitals Group  ?   BP 07/05/21 1147 (!) 153/92  ?   Pulse Rate 07/05/21 1147 75  ?   Resp 07/05/21 1147 18  ?   Temp 07/05/21 1147 98.4 ?F (36.9 ?C)  ?   Temp Source 07/05/21 1147 Oral  ?   SpO2 07/05/21 1147 100 %  ?   Weight 07/05/21 1145 179 lb 8 oz (81.4 kg)  ?   Height 07/05/21 1145 6' (1.829 m)  ?   Head Circumference --   ?   Peak Flow --   ?   Pain Score 07/05/21 1145 4  ?   Pain Loc --   ?   Pain Edu? --   ?   Excl. in [place]? --   ? ?No data found. ? ?Updated Vital Signs ?BP (!) 153/92 (BP Location: Left Arm)   Pulse 75   Temp 98.4 ?F (36.9 ?C) (Oral)   Resp 18   Ht 6' (1.829 m)   Wt 81.4 kg   SpO2 100%   BMI 24.34 kg/m?  ?   ? ?Physical Exam ?Constitutional:   ?   General: He is not in acute distress. ?   Appearance: Normal appearance. He is well-developed and normal weight.  ?HENT:  ?   Head: Normocephalic and atraumatic.  ?Eyes:  ?   Conjunctiva/sclera: Conjunctivae normal.  ?   Pupils: Pupils are equal, round, and reactive to light.  ?Neck:  ?   Comments: Patient has stiff and guarded movements.  He has tenderness in the right upper body of the trapezius muscle, out to the acromion and down the medial border of the scapula.  Limited range of motion secondary to pain.  Normal neuro exam upper extremities ?Cardiovascular:  ?   Rate and Rhythm: Normal rate.  ?Pulmonary:  ?   Effort: Pulmonary effort is normal. No respiratory distress.  ?Abdominal:  ?   General: There is no distension.  ?   Palpations: Abdomen  is soft.  ?Musculoskeletal:     ?  General: Normal range of motion.  ?   Cervical back: Normal range of motion. Tenderness present.  ?Skin: ?   General: Skin is warm and dry.  ?Neurological:  ?   Mental Status: He is alert.  ?   Sensory: No sensory deficit.  ?   Motor: No weakness.  ?   Gait: Gait normal.  ?   Deep Tendon Reflexes: Reflexes normal.  ?Psychiatric:     ?   Mood and Affect: Mood normal.  ? ? ? ?UC Treatments / Results  ?Labs ?(all labs ordered are listed, but only abnormal results are displayed) ?Labs Reviewed - No data to display ? ?EKG ? ? ?Radiology ?No results found. ? ?Procedures ?Procedures (including critical care time) ? ?Medications Ordered in UC ?Medications - No data to display ? ?Initial Impression / Assessment and Plan / UC Course  ?I have reviewed the triage vital signs and the nursing notes. ? ?Pertinent labs & imaging results that were available during my care of the patient were reviewed by me and considered in my medical decision making (see chart for details). ? ?  ? ?Final Clinical Impressions(s) / UC Diagnoses  ? ?Final diagnoses:  ?Neck pain, acute  ? ? ? ?Discharge Instructions   ? ?  ?Stop ibuprofen ?Take tylenol if needed 1000 mg up to 3 times a day ?Take the muscle relaxer as needed.  May cause drowsiness ?Take the prednisone 40 mg a day.  May take both tabs at one time each day.  Stop when you feel better ?Ice or heat to area ? ? ? ?ED Prescriptions   ? ? Medication Sig Dispense Auth. Provider  ? methocarbamol (ROBAXIN-750) 750 MG tablet Take 1 tablet (750 mg total) by mouth every 6 (six) hours as needed for muscle spasms. 20 tablet Raylene Everts, MD  ? predniSONE (DELTASONE) 20 MG tablet Take 1 tablet (20 mg total) by mouth 2 (two) times daily with a meal. 10 tablet Raylene Everts, MD  ? ?  ? ?PDMP not reviewed this encounter. ?  ?Raylene Everts, MD ?07/05/21 1244 ? ?

## 2021-07-06 DIAGNOSIS — M9902 Segmental and somatic dysfunction of thoracic region: Secondary | ICD-10-CM | POA: Diagnosis not present

## 2021-07-06 DIAGNOSIS — M9901 Segmental and somatic dysfunction of cervical region: Secondary | ICD-10-CM | POA: Diagnosis not present

## 2021-07-06 DIAGNOSIS — S134XXA Sprain of ligaments of cervical spine, initial encounter: Secondary | ICD-10-CM | POA: Diagnosis not present

## 2021-07-06 DIAGNOSIS — M62838 Other muscle spasm: Secondary | ICD-10-CM | POA: Diagnosis not present

## 2021-07-06 NOTE — Telephone Encounter (Signed)
Attempted prior authorization for Pantoprazole 40 mg. Cover my med kicked backed a message to call number on the back of patients card. I have called three numbers including [phone], [phone]. All numbers stated that there was not a representative available. I called the pharmacy twice [facility] and there was not a representative available. [facility] [place] to help until I can get in touch with someone. I will continue to work on this situation.   ?

## 2021-07-07 ENCOUNTER — Encounter: Payer: Self-pay | Admitting: Gastroenterology

## 2021-07-07 ENCOUNTER — Ambulatory Visit (AMBULATORY_SURGERY_CENTER): Payer: BC Managed Care – PPO | Admitting: Gastroenterology

## 2021-07-07 VITALS — BP 108/72 | HR 51 | Temp 97.1°F | Resp 14 | Ht 72.0 in | Wt 181.0 lb

## 2021-07-07 DIAGNOSIS — D122 Benign neoplasm of ascending colon: Secondary | ICD-10-CM

## 2021-07-07 DIAGNOSIS — Z1211 Encounter for screening for malignant neoplasm of colon: Secondary | ICD-10-CM | POA: Diagnosis not present

## 2021-07-07 DIAGNOSIS — D123 Benign neoplasm of transverse colon: Secondary | ICD-10-CM

## 2021-07-07 DIAGNOSIS — Z86010 Personal history of colonic polyps: Secondary | ICD-10-CM | POA: Diagnosis not present

## 2021-07-07 DIAGNOSIS — K635 Polyp of colon: Secondary | ICD-10-CM | POA: Diagnosis not present

## 2021-07-07 MED ORDER — SODIUM CHLORIDE 0.9 % IV SOLN: 500.0000 mL | Freq: Once | INTRAVENOUS | Status: DC

## 2021-07-07 NOTE — Patient Instructions (Signed)
YOU HAD AN ENDOSCOPIC PROCEDURE TODAY AT THE [facility]:   Refer to the procedure report that was given to you for any specific questions about what was found during the examination.  If the procedure report does not answer your questions, please call your gastroenterologist to clarify.  If you requested that your care partner not be given the details of your procedure findings, then the procedure report has been included in a sealed envelope for you to review at your convenience later.  YOU SHOULD EXPECT: Some feelings of bloating in the abdomen. Passage of more gas than usual.  Walking can help get rid of the air that was put into your GI tract during the procedure and reduce the bloating. If you had a lower endoscopy (such as a colonoscopy or flexible sigmoidoscopy) you may notice spotting of blood in your stool or on the toilet paper. If you underwent a bowel prep for your procedure, you may not have a normal bowel movement for a few days.  Please Note:  You might notice some irritation and congestion in your nose or some drainage.  This is from the oxygen used during your procedure.  There is no need for concern and it should clear up in a day or so.  SYMPTOMS TO REPORT IMMEDIATELY:   Following lower endoscopy (colonoscopy or flexible sigmoidoscopy):  Excessive amounts of blood in the stool  Significant tenderness or worsening of abdominal pains  Swelling of the abdomen that is new, acute  Fever of 100F or higher  For urgent or emergent issues, a gastroenterologist can be reached at any hour by calling [phone]. Do not use MyChart messaging for urgent concerns.    DIET:  We do recommend a small meal at first, but then you may proceed to your regular diet.  Drink plenty of fluids but you should avoid alcoholic beverages for 24 hours.  ACTIVITY:  You should plan to take it easy for the rest of today and you should NOT DRIVE or use heavy machinery until tomorrow (because  of the sedation medicines used during the test).    FOLLOW UP: Our staff will call the number listed on your records 48-72 hours following your procedure to check on you and address any questions or concerns that you may have regarding the information given to you following your procedure. If we do not reach you, we will leave a message.  We will attempt to reach you two times.  During this call, we will ask if you have developed any symptoms of COVID 19. If you develop any symptoms (ie: fever, flu-like symptoms, shortness of breath, cough etc.) before then, please call [phone].  If you test positive for Covid 19 in the 2 weeks post procedure, please call and report this information to us.    If any biopsies were taken you will be contacted by phone or by letter within the next 1-3 weeks.  Please call us at [phone] if you have not heard about the biopsies in 3 weeks.    SIGNATURES/CONFIDENTIALITY: You and/or your care partner have signed paperwork which will be entered into your electronic medical record.  These signatures attest to the fact that that the information above on your After Visit Summary has been reviewed and is understood.  Full responsibility of the confidentiality of this discharge information lies with you and/or your care-partner. 

## 2021-07-07 NOTE — Telephone Encounter (Signed)
Attempted to call provide line again. They were closed. I will again first thing Monday. ?

## 2021-07-07 NOTE — Progress Notes (Signed)
[facility] History and Physical ? ? ?Primary Care Physician:  Luetta Nutting, DO ? ? ?Reason for Procedure:   Colon cancer screening ? ?Plan:    Surveillance colonoscopy ? ? ? ? ?HPI: Ian Clark is a 61 y.o. male undergoing average risk screening colonoscopy.  His last colonoscopy was in 2017 and had hyperplastic polyps removed, but his bowel prep was fair quality.  He has no family history of colon cancer.  He has IBS managed with [facility].  He reported had an adenoma on his index colonoscopy in 2011 (report not available). ? ? ?Past Medical History:  ?Diagnosis Date  ? GERD (gastroesophageal reflux disease)   ? Hx of colonic polyps   ? Hyperlipidemia   ? PONV (postoperative nausea and vomiting)   ? ? ?Past Surgical History:  ?Procedure Laterality Date  ? COLONOSCOPY  2017  ? prep fair-hyperplastic polyps  ? EYE SURGERY Left   ? laser retinal surgery  ? HERNIA REPAIR    ? x2  ? INGUINAL HERNIA REPAIR Left 11/09/2019  ? Procedure: LAPAROSCOPIC LEFT INGUINAL HERNIA WITH MESH;  Surgeon: Jesusita Oka, MD;  Location: [facility];  Service: General;  Laterality: Left;  ? LEFT HEART CATH AND CORONARY ANGIOGRAPHY N/A 12/21/2019  ? Procedure: LEFT HEART CATH AND CORONARY ANGIOGRAPHY;  Surgeon: Lorretta Harp, MD;  Location: [facility];  Service: Cardiovascular;  Laterality: N/A;  ? RESECTION DISTAL CLAVICAL    ? metal there  ? SHOULDER SURGERY Left   ? TONSILLECTOMY    ? Ulnar nerve surgery    ? UPPER GASTROINTESTINAL ENDOSCOPY  2019  ? WISDOM TOOTH EXTRACTION    ? ? ?Prior to Admission medications   ?Medication Sig Start Date End Date Taking? Authorizing Provider  ?aspirin EC 81 MG tablet Take 1 tablet (81 mg total) by mouth daily. Swallow whole. 12/17/19  Yes Lorretta Harp, MD  ?b complex vitamins tablet Take 1 tablet by mouth daily.   Yes [provider]  ?BL GLUCOSAMINE-CHONDROITIN PO Take by mouth. 10/18/15  Yes [provider]  ?methocarbamol (ROBAXIN-750) 750 MG tablet Take  1 tablet (750 mg total) by mouth every 6 (six) hours as needed for muscle spasms. 07/05/21  Yes Raylene Everts, MD  ?Multiple Vitamins-Minerals (MULTIVITAMIN ADULTS PO) Take 1 tablet by mouth daily. 10/18/15  Yes [provider]  ?Omega-3 Fatty Acids (FISH OIL) 1000 MG CAPS Take 1 capsule by mouth daily. 10/18/15  Yes [provider]  ?pantoprazole (PROTONIX) 40 MG tablet Take 1 tablet (40 mg total) by mouth daily. 03/22/21  Yes Daryel November, MD  ?predniSONE (DELTASONE) 20 MG tablet Take 1 tablet (20 mg total) by mouth 2 (two) times daily with a meal. 07/05/21  Yes Raylene Everts, MD  ?rosuvastatin (CRESTOR) 5 MG tablet Take 1 tablet (5 mg total) by mouth every other day. 11/17/20 06/18/23 Yes Luetta Nutting, DO  ?LORazepam (ATIVAN) 1 MG tablet     [provider]  ?sildenafil (VIAGRA) 100 MG tablet Take by mouth. 05/30/20   [provider]  ? ? ?Current Outpatient Medications  ?Medication Sig Dispense Refill  ? aspirin EC 81 MG tablet Take 1 tablet (81 mg total) by mouth daily. Swallow whole. 90 tablet 3  ? b complex vitamins tablet Take 1 tablet by mouth daily.    ? BL GLUCOSAMINE-CHONDROITIN PO Take by mouth.    ? methocarbamol (ROBAXIN-750) 750 MG tablet Take 1 tablet (750 mg total) by mouth every  6 (six) hours as needed for muscle spasms. 20 tablet 0  ? Multiple Vitamins-Minerals (MULTIVITAMIN ADULTS PO) Take 1 tablet by mouth daily.    ? Omega-3 Fatty Acids (FISH OIL) 1000 MG CAPS Take 1 capsule by mouth daily.    ? pantoprazole (PROTONIX) 40 MG tablet Take 1 tablet (40 mg total) by mouth daily. 90 tablet 1  ? predniSONE (DELTASONE) 20 MG tablet Take 1 tablet (20 mg total) by mouth 2 (two) times daily with a meal. 10 tablet 0  ? rosuvastatin (CRESTOR) 5 MG tablet Take 1 tablet (5 mg total) by mouth every other day. 45 tablet 3  ? LORazepam (ATIVAN) 1 MG tablet     ? sildenafil (VIAGRA) 100 MG tablet Take by mouth.    ? ?Current Facility-Administered Medications   ?Medication Dose Route Frequency Provider Last Rate Last Admin  ? 0.9 %  sodium chloride infusion  500 mL Intravenous Once Daryel November, MD      ? ? ?Allergies as of 07/07/2021 - Review Complete 07/07/2021  ?Allergen Reaction Noted  ? Morphine and related Other (See Comments) 03/13/2018  ? ? ?Family History  ?Problem Relation Age of Onset  ? Hypertension Mother   ? Hypertension Father   ? Stomach cancer Neg Hx   ? Esophageal cancer Neg Hx   ? Pancreatic cancer Neg Hx   ? Colon cancer Neg Hx   ? ? ?Social History  ? ?Socioeconomic History  ? Marital status: Married  ?  Spouse name: Not on file  ? Number of children: Not on file  ? Years of education: Not on file  ? Highest education level: Not on file  ?Occupational History  ? Not on file  ?Tobacco Use  ? Smoking status: Former  ? Smokeless tobacco: Never  ?Vaping Use  ? Vaping Use: Never used  ?Substance and Sexual Activity  ? Alcohol use: Yes  ?  Alcohol/week: 14.0 standard drinks  ?  Types: 14 Standard drinks or equivalent per week  ? Drug use: Never  ? Sexual activity: Yes  ?Other Topics Concern  ? Not on file  ?Social History Narrative  ? Not on file  ? ?Social Determinants of Health  ? ?Financial Resource Strain: Not on file  ?Food Insecurity: Not on file  ?Transportation Needs: Not on file  ?Physical Activity: Not on file  ?Stress: Not on file  ?Social Connections: Not on file  ?Intimate Partner Violence: Not on file  ? ? ?Review of Systems: ? ?All other review of systems negative except as mentioned in the HPI. ? ?Physical Exam: ?Vital signs ?BP 132/74   Pulse 66   Temp (!) 97.1 ?F (36.2 ?C)   Ht 6' (1.829 m)   Wt 181 lb (82.1 kg)   SpO2 100%   BMI 24.55 kg/m?  ? ?General:   Alert,  Well-developed, well-nourished, pleasant and cooperative in NAD ?Airway:  Mallampati 1 ?Lungs:  Clear throughout to auscultation.   ?Heart:  Regular rate and rhythm; no murmurs, clicks, rubs,  or gallops. ?Abdomen:  Soft, nontender and nondistended. Normal bowel  sounds.   ?Neuro/Psych:  Normal mood and affect. A and O x 3 ? ? ?Tekesha Almgren E. Candis Schatz, MD ?[facility] ? ?

## 2021-07-07 NOTE — Progress Notes (Signed)
To Pacu, VSs. Report to RN.tb ?

## 2021-07-07 NOTE — Progress Notes (Signed)
Pt's states no medical or surgical changes since previsit or office visit. 

## 2021-07-07 NOTE — Progress Notes (Signed)
Called to room to assist during endoscopic procedure.  Patient ID and intended procedure confirmed with present staff. Received instructions for my participation in the procedure from the performing physician.  

## 2021-07-07 NOTE — Op Note (Signed)
[place] ?Patient Name: Ian Clark ?Procedure Date: 07/07/2021 8:04 AM ?MRN: [id number] ?Endoscopist: Hulan Szumski E. Candis Schatz , MD ?Age: 61 ?Referring MD:  ?Date of Birth: Aug 08, 1960 ?Gender: Male ?Account #: [id number] ?Procedure:                Colonoscopy ?Indications:              High risk colon cancer surveillance: Personal  ?                          history of colonic polyps ?Medicines:                Monitored Anesthesia Care ?Procedure:                Pre-Anesthesia Assessment: ?                          - Prior to the procedure, a History and Physical  ?                          was performed, and patient medications and  ?                          allergies were reviewed. The patient's tolerance of  ?                          previous anesthesia was also reviewed. The risks  ?                          and benefits of the procedure and the sedation  ?                          options and risks were discussed with the patient.  ?                          All questions were answered, and informed consent  ?                          was obtained. Prior Anticoagulants: The patient has  ?                          taken no previous anticoagulant or antiplatelet  ?                          agents. ASA Grade Assessment: II - A patient with  ?                          mild systemic disease. After reviewing the risks  ?                          and benefits, the patient was deemed in  ?                          satisfactory condition to undergo the procedure. ?  After obtaining informed consent, the colonoscope  ?                          was passed under direct vision. Throughout the  ?                          procedure, the patient's blood pressure, pulse, and  ?                          oxygen saturations were monitored continuously. The  ?                          Olympus CF-HQ190L ([id number]) Colonoscope was  ?                          introduced through the anus and advanced  to the the  ?                          terminal ileum, with identification of the  ?                          appendiceal orifice and IC valve. The colonoscopy  ?                          was performed without difficulty. The patient  ?                          tolerated the procedure well. The quality of the  ?                          bowel preparation was adequate. The terminal ileum,  ?                          ileocecal valve, appendiceal orifice, and rectum  ?                          were photographed. The bowel preparation used was a  ?                          2-day prep using Miralax and SUPREP via split dose  ?                          instruction. ?Scope In: 8:11:49 AM ?Scope Out: 8:29:52 AM ?Scope Withdrawal Time: 0 hours 14 minutes 28 seconds  ?Total Procedure Duration: 0 hours 18 minutes 3 seconds  ?Findings:                 The perianal and digital rectal examinations were  ?                          normal. Pertinent negatives include normal  ?                          sphincter tone and no palpable rectal lesions. ?  A 2 mm polyp was found in the ascending colon. The  ?                          polyp was sessile. The polyp was removed with a  ?                          cold snare. Resection and retrieval were complete.  ?                          Estimated blood loss was minimal. ?                          A 4 mm polyp was found in the transverse colon. The  ?                          polyp was sessile. The polyp was removed with a  ?                          cold snare. Resection and retrieval were complete.  ?                          Estimated blood loss was minimal. ?                          A 3 mm polyp was found in the transverse colon. The  ?                          polyp was flat. The polyp was removed with a cold  ?                          biopsy forceps. Resection and retrieval were  ?                          complete. Estimated blood loss was minimal. ?                           A few small-mouthed diverticula were found in the  ?                          sigmoid colon. ?                          The exam was otherwise normal throughout the  ?                          examined colon. ?                          The terminal ileum appeared normal. ?                          The retroflexed view of the distal rectum and anal  ?  verge was normal and showed no anal or rectal  ?                          abnormalities. ?Complications:            No immediate complications. ?Estimated Blood Loss:     Estimated blood loss was minimal. ?Impression:               - One 2 mm polyp in the ascending colon, removed  ?                          with a cold snare. Resected and retrieved. ?                          - One 4 mm polyp in the transverse colon, removed  ?                          with a cold snare. Resected and retrieved. ?                          - One 3 mm polyp in the transverse colon, removed  ?                          with a cold biopsy forceps. Resected and retrieved. ?                          - Diverticulosis in the sigmoid colon. ?                          - The examined portion of the ileum was normal. ?                          - The distal rectum and anal verge are normal on  ?                          retroflexion view. ?Recommendation:           - Patient has a contact number available for  ?                          emergencies. The signs and symptoms of potential  ?                          delayed complications were discussed with the  ?                          patient. Return to normal activities tomorrow.  ?                          Written discharge instructions were provided to the  ?                          patient. ?                          -  Resume previous diet. ?                          - Continue present medications. ?                          - Await pathology results. ?                          - Repeat colonoscopy (date  not yet determined) for  ?                          surveillance based on pathology results. ?Brondon Wann E. Candis Schatz, MD ?07/07/2021 8:35:43 AM ?This report has been signed electronically. ?

## 2021-07-10 DIAGNOSIS — S134XXA Sprain of ligaments of cervical spine, initial encounter: Secondary | ICD-10-CM | POA: Diagnosis not present

## 2021-07-10 DIAGNOSIS — M62838 Other muscle spasm: Secondary | ICD-10-CM | POA: Diagnosis not present

## 2021-07-10 DIAGNOSIS — M9901 Segmental and somatic dysfunction of cervical region: Secondary | ICD-10-CM | POA: Diagnosis not present

## 2021-07-10 DIAGNOSIS — M9902 Segmental and somatic dysfunction of thoracic region: Secondary | ICD-10-CM | POA: Diagnosis not present

## 2021-07-11 ENCOUNTER — Telehealth: Payer: Self-pay

## 2021-07-11 NOTE — Telephone Encounter (Signed)
?  Follow up Call- ? ? ?  07/07/2021  ?  7:52 AM  ?Call back number  ?Post procedure Call Back phone  # [phone]  ?Permission to leave phone message Yes  ?  ? ?Patient questions: ? ?Do you have a fever, pain , or abdominal swelling? No. ?Pain Score  0 * ? ?Have you tolerated food without any problems? Yes.   ? ?Have you been able to return to your normal activities? Yes.   ? ?Do you have any questions about your discharge instructions: ?Diet   No. ?Medications  No. ?Follow up visit  No. ? ?Do you have questions or concerns about your Care? No. ? ?Actions: ?* If pain score is 4 or above: ?No action needed, pain <4. ? ? ?

## 2021-07-12 DIAGNOSIS — M9901 Segmental and somatic dysfunction of cervical region: Secondary | ICD-10-CM | POA: Diagnosis not present

## 2021-07-12 DIAGNOSIS — M62838 Other muscle spasm: Secondary | ICD-10-CM | POA: Diagnosis not present

## 2021-07-12 DIAGNOSIS — S134XXA Sprain of ligaments of cervical spine, initial encounter: Secondary | ICD-10-CM | POA: Diagnosis not present

## 2021-07-12 DIAGNOSIS — M9902 Segmental and somatic dysfunction of thoracic region: Secondary | ICD-10-CM | POA: Diagnosis not present

## 2021-07-13 ENCOUNTER — Telehealth: Payer: Self-pay

## 2021-07-13 NOTE — Progress Notes (Signed)
Good news: the polyps that I removed during your recent examination were NOT precancerous.  Because of your history of precancerous polyps, I recommend a repeat colonoscopy in 7 years. ? ?If you develop any new rectal bleeding, abdominal pain or significant bowel habit changes, please contact me before then.   ?

## 2021-07-14 NOTE — Telephone Encounter (Signed)
Attempted f/u call. No answer, left VM. 

## 2021-07-17 DIAGNOSIS — M9901 Segmental and somatic dysfunction of cervical region: Secondary | ICD-10-CM | POA: Diagnosis not present

## 2021-07-17 DIAGNOSIS — M9902 Segmental and somatic dysfunction of thoracic region: Secondary | ICD-10-CM | POA: Diagnosis not present

## 2021-07-17 DIAGNOSIS — M62838 Other muscle spasm: Secondary | ICD-10-CM | POA: Diagnosis not present

## 2021-07-17 DIAGNOSIS — S134XXA Sprain of ligaments of cervical spine, initial encounter: Secondary | ICD-10-CM | POA: Diagnosis not present

## 2021-07-24 DIAGNOSIS — S134XXA Sprain of ligaments of cervical spine, initial encounter: Secondary | ICD-10-CM | POA: Diagnosis not present

## 2021-07-24 DIAGNOSIS — M9901 Segmental and somatic dysfunction of cervical region: Secondary | ICD-10-CM | POA: Diagnosis not present

## 2021-07-24 DIAGNOSIS — M9902 Segmental and somatic dysfunction of thoracic region: Secondary | ICD-10-CM | POA: Diagnosis not present

## 2021-07-24 DIAGNOSIS — M62838 Other muscle spasm: Secondary | ICD-10-CM | POA: Diagnosis not present

## 2021-07-28 DIAGNOSIS — K219 Gastro-esophageal reflux disease without esophagitis: Secondary | ICD-10-CM | POA: Diagnosis not present

## 2021-07-28 DIAGNOSIS — K589 Irritable bowel syndrome without diarrhea: Secondary | ICD-10-CM | POA: Diagnosis not present

## 2021-07-28 DIAGNOSIS — H33302 Unspecified retinal break, left eye: Secondary | ICD-10-CM | POA: Diagnosis not present

## 2021-07-28 DIAGNOSIS — E291 Testicular hypofunction: Secondary | ICD-10-CM | POA: Diagnosis not present

## 2021-08-01 DIAGNOSIS — M9901 Segmental and somatic dysfunction of cervical region: Secondary | ICD-10-CM | POA: Diagnosis not present

## 2021-08-01 DIAGNOSIS — M9902 Segmental and somatic dysfunction of thoracic region: Secondary | ICD-10-CM | POA: Diagnosis not present

## 2021-08-01 DIAGNOSIS — S134XXA Sprain of ligaments of cervical spine, initial encounter: Secondary | ICD-10-CM | POA: Diagnosis not present

## 2021-08-01 DIAGNOSIS — M62838 Other muscle spasm: Secondary | ICD-10-CM | POA: Diagnosis not present

## 2021-08-15 DIAGNOSIS — M62838 Other muscle spasm: Secondary | ICD-10-CM | POA: Diagnosis not present

## 2021-08-15 DIAGNOSIS — M9901 Segmental and somatic dysfunction of cervical region: Secondary | ICD-10-CM | POA: Diagnosis not present

## 2021-08-15 DIAGNOSIS — S134XXA Sprain of ligaments of cervical spine, initial encounter: Secondary | ICD-10-CM | POA: Diagnosis not present

## 2021-08-15 DIAGNOSIS — M9902 Segmental and somatic dysfunction of thoracic region: Secondary | ICD-10-CM | POA: Diagnosis not present

## 2021-09-05 DIAGNOSIS — S134XXA Sprain of ligaments of cervical spine, initial encounter: Secondary | ICD-10-CM | POA: Diagnosis not present

## 2021-09-05 DIAGNOSIS — M62838 Other muscle spasm: Secondary | ICD-10-CM | POA: Diagnosis not present

## 2021-09-05 DIAGNOSIS — M9902 Segmental and somatic dysfunction of thoracic region: Secondary | ICD-10-CM | POA: Diagnosis not present

## 2021-09-05 DIAGNOSIS — M9901 Segmental and somatic dysfunction of cervical region: Secondary | ICD-10-CM | POA: Diagnosis not present

## 2021-12-16 ENCOUNTER — Other Ambulatory Visit: Payer: Self-pay | Admitting: Family Medicine

## 2021-12-16 DIAGNOSIS — E782 Mixed hyperlipidemia: Secondary | ICD-10-CM

## 2021-12-28 ENCOUNTER — Other Ambulatory Visit: Payer: Self-pay | Admitting: Gastroenterology

## 2022-02-26 ENCOUNTER — Telehealth: Payer: Self-pay

## 2022-02-26 MED ORDER — LORAZEPAM 1 MG PO TABS: ORAL_TABLET | ORAL | 0 refills | Status: DC

## 2022-02-26 NOTE — Telephone Encounter (Signed)
Completed.

## 2022-03-09 DIAGNOSIS — M5441 Lumbago with sciatica, right side: Secondary | ICD-10-CM | POA: Diagnosis not present

## 2022-04-03 ENCOUNTER — Encounter: Payer: Self-pay | Admitting: Gastroenterology

## 2022-04-04 ENCOUNTER — Other Ambulatory Visit: Payer: Self-pay

## 2022-04-04 ENCOUNTER — Telehealth: Payer: Self-pay | Admitting: Pharmacy Technician

## 2022-04-04 ENCOUNTER — Other Ambulatory Visit: Payer: Self-pay | Admitting: Gastroenterology

## 2022-04-04 ENCOUNTER — Other Ambulatory Visit (HOSPITAL_COMMUNITY): Payer: Self-pay

## 2022-04-04 MED ORDER — PANTOPRAZOLE SODIUM 40 MG PO TBEC: 40.0000 mg | DELAYED_RELEASE_TABLET | Freq: Every day | ORAL | 1 refills | Status: DC

## 2022-04-04 NOTE — Telephone Encounter (Signed)
PA is approved. Pharmacy and patient has been contacted. Pt agreed to fill for 90/90 @ $47.40 versus 30/30 @ $21.20.

## 2022-04-04 NOTE — Telephone Encounter (Signed)
Patient Advocate Encounter  Prior Authorization for PANTOPRAZOLE '40MG'$  has been approved.    PA# [id number] Effective dates: 1.17.24 through 1.17.27    Received notification from [facility] that prior authorization for PANTOPRAZOLE '40MG'$  is required.   PA submitted on 1.17.24 VIA PHONE [phone] [id number] Status is pending

## 2022-04-09 DIAGNOSIS — L814 Other melanin hyperpigmentation: Secondary | ICD-10-CM | POA: Diagnosis not present

## 2022-04-09 DIAGNOSIS — L579 Skin changes due to chronic exposure to nonionizing radiation, unspecified: Secondary | ICD-10-CM | POA: Diagnosis not present

## 2022-04-09 DIAGNOSIS — L821 Other seborrheic keratosis: Secondary | ICD-10-CM | POA: Diagnosis not present

## 2022-04-09 DIAGNOSIS — D485 Neoplasm of uncertain behavior of skin: Secondary | ICD-10-CM | POA: Diagnosis not present

## 2022-07-27 DIAGNOSIS — F419 Anxiety disorder, unspecified: Secondary | ICD-10-CM | POA: Diagnosis not present

## 2022-07-27 DIAGNOSIS — Z0189 Encounter for other specified special examinations: Secondary | ICD-10-CM | POA: Diagnosis not present

## 2022-07-27 DIAGNOSIS — N529 Male erectile dysfunction, unspecified: Secondary | ICD-10-CM | POA: Diagnosis not present

## 2022-07-27 DIAGNOSIS — K219 Gastro-esophageal reflux disease without esophagitis: Secondary | ICD-10-CM | POA: Diagnosis not present

## 2022-07-27 DIAGNOSIS — K589 Irritable bowel syndrome without diarrhea: Secondary | ICD-10-CM | POA: Diagnosis not present

## 2022-08-14 DIAGNOSIS — J019 Acute sinusitis, unspecified: Secondary | ICD-10-CM | POA: Diagnosis not present

## 2022-08-14 DIAGNOSIS — L237 Allergic contact dermatitis due to plants, except food: Secondary | ICD-10-CM | POA: Diagnosis not present

## 2022-12-22 ENCOUNTER — Other Ambulatory Visit: Payer: Self-pay | Admitting: Gastroenterology

## 2023-04-09 DIAGNOSIS — L821 Other seborrheic keratosis: Secondary | ICD-10-CM | POA: Diagnosis not present

## 2023-04-09 DIAGNOSIS — D225 Melanocytic nevi of trunk: Secondary | ICD-10-CM | POA: Diagnosis not present

## 2023-04-09 DIAGNOSIS — D235 Other benign neoplasm of skin of trunk: Secondary | ICD-10-CM | POA: Diagnosis not present

## 2023-04-09 DIAGNOSIS — L814 Other melanin hyperpigmentation: Secondary | ICD-10-CM | POA: Diagnosis not present

## 2023-06-22 ENCOUNTER — Other Ambulatory Visit: Payer: Self-pay | Admitting: Gastroenterology

## 2023-07-17 ENCOUNTER — Other Ambulatory Visit: Payer: Self-pay | Admitting: Gastroenterology

## 2023-07-19 ENCOUNTER — Other Ambulatory Visit: Payer: Self-pay | Admitting: Gastroenterology

## 2023-07-22 ENCOUNTER — Other Ambulatory Visit: Payer: Self-pay | Admitting: Gastroenterology

## 2023-08-26 ENCOUNTER — Telehealth: Payer: Self-pay | Admitting: Gastroenterology

## 2023-08-26 ENCOUNTER — Other Ambulatory Visit: Payer: Self-pay | Admitting: Gastroenterology

## 2023-08-26 MED ORDER — PANTOPRAZOLE SODIUM 40 MG PO TBEC: 40.0000 mg | DELAYED_RELEASE_TABLET | Freq: Every day | ORAL | 0 refills | Status: DC

## 2023-08-26 NOTE — Telephone Encounter (Signed)
 Script sent to pharmacy.

## 2023-08-26 NOTE — Telephone Encounter (Signed)
 Requesting medication refill for proitonix. Patient scheduled for f/u with provider on 09/23/2023.   Please advise.

## 2023-08-26 NOTE — Telephone Encounter (Signed)
 Forwarded to Dr. Cherryl Corona since patient has not been seen since 2023

## 2023-08-28 ENCOUNTER — Ambulatory Visit: Admitting: Physician Assistant

## 2023-08-28 ENCOUNTER — Ambulatory Visit: Payer: Self-pay

## 2023-08-28 VITALS — BP 154/96 | HR 97 | Ht 72.0 in | Wt 206.5 lb

## 2023-08-28 DIAGNOSIS — H9311 Tinnitus, right ear: Secondary | ICD-10-CM | POA: Diagnosis not present

## 2023-08-28 DIAGNOSIS — R030 Elevated blood-pressure reading, without diagnosis of hypertension: Secondary | ICD-10-CM | POA: Insufficient documentation

## 2023-08-28 MED ORDER — FLUTICASONE PROPIONATE 50 MCG/ACT NA SUSP: 2.0000 | Freq: Every day | NASAL | 0 refills | Status: DC

## 2023-08-28 MED ORDER — HYDROCHLOROTHIAZIDE 12.5 MG PO TABS: 12.5000 mg | ORAL_TABLET | Freq: Every day | ORAL | 0 refills | Status: DC

## 2023-08-28 NOTE — Telephone Encounter (Signed)
  FYI Only or Action Required?: FYI only for provider  Patient was last seen in primary care on 03/07/2021 by Adela Holter, DO. Called Nurse Triage reporting Hypertension and Tinnitus. Symptoms began yesterday. Interventions attempted: Nothing. Symptoms are: hypertension, right ear pressure and tinnitus gradually worsening.  Triage Disposition: See Physician Within 24 Hours  Patient/caregiver understands and will follow disposition?: Yes                                Summary: ear ringing/congestion   Copied From CRM [id number]. Reason for Triage: Ear ringing and ear congestion, bp was high yesterday. Please call [phone]         Reason for Disposition  MODERATE-SEVERE tinnitus (i.e., interferes with work, school, or sleep)  Systolic BP  >= 180 OR Diastolic >= 110  Answer Assessment - Initial Assessment Questions 1. DESCRIPTION: Describe the sound you are hearing. (e.g., buzzing, hissing, humming, ringing)     Whistling noise. He states it only happens when talking or when listening to music at certain pitches nut the ringing and pressure are constant.  2. LOCATION: Is the sound in one or both ears? If one, ask: Which ear?     Right ear.  3. SEVERITY: How bad is it?    - MILD: Doesn't interfere with normal activities, only can hear in a quiet room.    - MODERATE-SEVERE: interferes with work, school, sleep, or other activities.      Moderate.  4. ONSET: When did this begin? Did it start suddenly or come on gradually?     Ear pressure yesterday, whistling and ringing today.  5. PATTERN: Does this come and go, or has it been constant since it started?     Constant, ringing and whistling started today.  6. HEARING LOSS: Is your hearing decreased? (e.g., normal, decreased)       Decreased hearing in right ear.  7. OTHER SYMPTOMS: Do you have any other symptoms? (e.g., dizziness, earache)     Ear pressure  (right); denies any  pain.  8. PREGNANCY: Is there any chance you are pregnant? When was your last menstrual period?     N/A.  Answer Assessment - Initial Assessment Questions 1. BLOOD PRESSURE: What is the blood pressure? Did you take at least two measurements 5 minutes apart?     156/100, 147/111, 158/111 2. ONSET: When did you take your blood pressure?     BP elevated yesterday. Today readings taken around 0930.  3. HOW: How did you take your blood pressure? (e.g., automatic home BP monitor, visiting nurse)     Automatic home BP monitor (wrist).  4. HISTORY: Do you have a history of high blood pressure?     No.  5. MEDICINES: Are you taking any medicines for blood pressure? Have you missed any doses recently?     No.  6. OTHER SYMPTOMS: Do you have any symptoms? (e.g., blurred vision, chest pain, difficulty breathing, headache, weakness)     Right ear symptoms (see triage).  7. PREGNANCY: Is there any chance you are pregnant? When was your last menstrual period?     N/A.  Patient denies chest pain, difficulty breathing, headache, changes in speech or vision, unilateral numbness or weakness.  Protocols used: Tinnitus-A-AH, Blood Pressure - High-A-AH

## 2023-08-28 NOTE — Progress Notes (Deleted)
 Error

## 2023-08-28 NOTE — Patient Instructions (Addendum)
 Keep log and bring in to BP recheck visit Start hydrochlorothiazide 12.5mg  daily Start flonase to help with ear ringing  If BP not coming down at all by end of week let us  know so we can increase dose  Managing Your Hypertension Hypertension, also called high blood pressure, is when the force of the blood pressing against the walls of the arteries is too strong. Arteries are blood vessels that carry blood from your heart throughout your body. Hypertension forces the heart to work harder to pump blood and may cause the arteries to become narrow or stiff. Understanding blood pressure readings A blood pressure reading includes a higher number over a lower number: The first, or top, number is called the systolic pressure. It is a measure of the pressure in your arteries as your heart beats. The second, or bottom number, is called the diastolic pressure. It is a measure of the pressure in your arteries as the heart relaxes. For most people, a normal blood pressure is below 120/80. Your personal target blood pressure may vary depending on your medical conditions, your age, and other factors. Blood pressure is classified into four stages. Based on your blood pressure reading, your health care provider may use the following stages to determine what type of treatment you need, if any. Systolic pressure and diastolic pressure are measured in a unit called millimeters of mercury (mmHg). Normal Systolic pressure: below 120. Diastolic pressure: below 80. Elevated Systolic pressure: 120-129. Diastolic pressure: below 80. Hypertension stage 1 Systolic pressure: 130-139. Diastolic pressure: 80-89. Hypertension stage 2 Systolic pressure: 140 or above. Diastolic pressure: 90 or above. How can this condition affect me? Managing your hypertension is very important. Over time, hypertension can damage the arteries and decrease blood flow to parts of the body, including the brain, heart, and kidneys. Having  untreated or uncontrolled hypertension can lead to: A heart attack. A stroke. A weakened blood vessel (aneurysm). Heart failure. Kidney damage. Eye damage. Memory and concentration problems. Vascular dementia. What actions can I take to manage this condition? Hypertension can be managed by making lifestyle changes and possibly by taking medicines. Your health care provider will help you make a plan to bring your blood pressure within a normal range. You may be referred for counseling on a healthy diet and physical activity. Nutrition  Eat a diet that is high in fiber and potassium, and low in salt (sodium), added sugar, and fat. An example eating plan is called the DASH diet. DASH stands for Dietary Approaches to Stop Hypertension. To eat this way: Eat plenty of fresh fruits and vegetables. Try to fill one-half of your plate at each meal with fruits and vegetables. Eat whole grains, such as whole-wheat pasta, brown rice, or whole-grain bread. Fill about one-fourth of your plate with whole grains. Eat low-fat dairy products. Avoid fatty cuts of meat, processed or cured meats, and poultry with skin. Fill about one-fourth of your plate with lean proteins such as fish, chicken without skin, beans, eggs, and tofu. Avoid pre-made and processed foods. These tend to be higher in sodium, added sugar, and fat. Reduce your daily sodium intake. Many people with hypertension should eat less than 1,500 mg of sodium a day. Lifestyle  Work with your health care provider to maintain a healthy body weight or to lose weight. Ask what an ideal weight is for you. Get at least 30 minutes of exercise that causes your heart to beat faster (aerobic exercise) most days of the week. Activities may  include walking, swimming, or biking. Include exercise to strengthen your muscles (resistance exercise), such as weight lifting, as part of your weekly exercise routine. Try to do these types of exercises for 30 minutes at  least 3 days a week. Do not use any products that contain nicotine or tobacco. These products include cigarettes, chewing tobacco, and vaping devices, such as e-cigarettes. If you need help quitting, ask your health care provider. Control any long-term (chronic) conditions you have, such as high cholesterol or diabetes. Identify your sources of stress and find ways to manage stress. This may include meditation, deep breathing, or making time for fun activities. Alcohol use Do not drink alcohol if: Your health care provider tells you not to drink. You are pregnant, may be pregnant, or are planning to become pregnant. If you drink alcohol: Limit how much you have to: 0-1 drink a day for women. 0-2 drinks a day for men. Know how much alcohol is in your drink. In the [place] one drink equals one 12 oz bottle of beer (355 mL), one 5 oz glass of wine (148 mL), or one 1 oz glass of hard liquor (44 mL). Medicines Your health care provider may prescribe medicine if lifestyle changes are not enough to get your blood pressure under control and if: Your systolic blood pressure is 130 or higher. Your diastolic blood pressure is 80 or higher. Take medicines only as told by your health care provider. Follow the directions carefully. Blood pressure medicines must be taken as told by your health care provider. The medicine does not work as well when you skip doses. Skipping doses also puts you at risk for problems. Monitoring Before you monitor your blood pressure: Do not smoke, drink caffeinated beverages, or exercise within 30 minutes before taking a measurement. Use the bathroom and empty your bladder (urinate). Sit quietly for at least 5 minutes before taking measurements. Monitor your blood pressure at home as told by your health care provider. To do this: Sit with your back straight and supported. Place your feet flat on the floor. Do not cross your legs. Support your arm on a flat surface, such as a  table. Make sure your upper arm is at heart level. Each time you measure, take two or three readings one minute apart and record the results. You may also need to have your blood pressure checked regularly by your health care provider. General information Talk with your health care provider about your diet, exercise habits, and other lifestyle factors that may be contributing to hypertension. Review all the medicines you take with your health care provider because there may be side effects or interactions. Keep all follow-up visits. Your health care provider can help you create and adjust your plan for managing your high blood pressure. Where to find more information National Heart, Lung, and Blood Institute: [employer].is American Heart Association: www.heart.org Contact a health care provider if: You think you are having a reaction to medicines you have taken. You have repeated (recurrent) headaches. You feel dizzy. You have swelling in your ankles. You have trouble with your vision. Get help right away if: You develop a severe headache or confusion. You have unusual weakness or numbness, or you feel faint. You have severe pain in your chest or abdomen. You vomit repeatedly. You have trouble breathing. These symptoms may be an emergency. Get help right away. Call 911. Do not wait to see if the symptoms will go away. Do not drive yourself to the hospital. Summary  Hypertension is when the force of blood pumping through your arteries is too strong. If this condition is not controlled, it may put you at risk for serious complications. Your personal target blood pressure may vary depending on your medical conditions, your age, and other factors. For most people, a normal blood pressure is less than 120/80. Hypertension is managed by lifestyle changes, medicines, or both. Lifestyle changes to help manage hypertension include losing weight, eating a healthy, low-sodium diet, exercising  more, stopping smoking, and limiting alcohol. This information is not intended to replace advice given to you by your health care provider. Make sure you discuss any questions you have with your health care provider. Document Revised: 11/17/2020 Document Reviewed: 11/17/2020 Elsevier Patient Education  2024 [employer].

## 2023-08-28 NOTE — Telephone Encounter (Signed)
 Patient scheduled today 08/28/2023 with Jade Breeback,PA

## 2023-08-28 NOTE — Progress Notes (Signed)
 Acute Office Visit  Subjective:     Patient ID: Ian Clark, male    DOB: September 08, 1960, 63 y.o.   MRN: [id number]  Chief Complaint  Patient presents with   Hypertension    Pt reports that he was seen at the dentist yesterday and his bp was elevated.    Tinnitus    Pt reports that his R ear feels stopped up and it has been ringing and now sounds like a whistle    HPI Patient is in today to follow up on elevated BP at the dentist yesterday of 160/100 and new right ear ringing and plugged sensation for a few days.   Pt has not been checking BP at home. He had some elevated BP readings over a year ago in office but started checking at home and running 110s over 70 and stopped checking. He has some intermittent chest discomfort but associated with hiatal hernia and eating. Not a new sensation. He denies any vision changes or shortness of breath. He has not started or stopped any new medications or supplements.   His right ear ringing and bothersome and concerned it is due to BP. He denies any fever, chills, sinus pressure, ST, cough or other URI symptoms.   Pt has not tried anything to make better.   ROS See HPI.      Objective:    BP (!) 154/96 (BP Location: Right Arm)   Pulse 97   Ht 6' (1.829 m)   Wt 206 lb 8 oz (93.7 kg)   SpO2 100%   BMI 28.01 kg/m  BP Readings from Last 3 Encounters:  08/28/23 (!) 154/96  07/07/21 108/72  07/05/21 (!) 153/92   Wt Readings from Last 3 Encounters:  08/28/23 206 lb 8 oz (93.7 kg)  07/07/21 181 lb (82.1 kg)  07/05/21 179 lb 8 oz (81.4 kg)      Physical Exam Constitutional:      Appearance: Normal appearance.  HENT:     Head: Normocephalic.     Right Ear: Tympanic membrane, ear canal and external ear normal. There is no impacted cerumen.     Left Ear: Tympanic membrane, ear canal and external ear normal. There is no impacted cerumen.     Mouth/Throat:     Mouth: Mucous membranes are moist.   Eyes:     Conjunctiva/sclera:  Conjunctivae normal.    Cardiovascular:     Rate and Rhythm: Normal rate.  Pulmonary:     Effort: Pulmonary effort is normal.     Breath sounds: Normal breath sounds.   Musculoskeletal:     Cervical back: Normal range of motion and neck supple. No tenderness.     Right lower leg: No edema.     Left lower leg: No edema.  Lymphadenopathy:     Cervical: No cervical adenopathy.   Neurological:     General: No focal deficit present.     Mental Status: He is alert and oriented to person, place, and time.   Psychiatric:        Mood and Affect: Mood normal.           Assessment & Plan:  Ian Clark was seen today for hypertension and tinnitus.  Diagnoses and all orders for this visit:  Elevated blood pressure reading in office without diagnosis of hypertension -     CMP14+EGFR -     hydrochlorothiazide  (HYDRODIURIL ) 12.5 MG tablet; Take 1 tablet (12.5 mg total) by mouth daily.  Tinnitus, right  ear -     fluticasone  (FLONASE ) 50 MCG/ACT nasal spray; Place 2 sprays into both nostrils daily.   BP elevated in office today Cmp ordered today Start checking BP at home and keep log to bring back in for nurse visit in 2 weeks Start hydrochlorothiazide  daily Discussed side effects ? Tinnitus due to BP being elevated or ETD Start flonase  daily as well Discussed low salt diet and regular exercise Follow up in 2 weeks    Return in about 2 weeks (around 09/11/2023) for BP recheck.  Bruchy Mikel, PA-C

## 2023-08-29 ENCOUNTER — Ambulatory Visit: Payer: Self-pay | Admitting: Physician Assistant

## 2023-08-29 LAB — CMP14+EGFR
ALT: 34 IU/L (ref 0–44)
AST: 26 IU/L (ref 0–40)
Albumin: 4.7 g/dL (ref 3.9–4.9)
Alkaline Phosphatase: 91 IU/L (ref 44–121)
BUN/Creatinine Ratio: 14 (ref 10–24)
BUN: 11 mg/dL (ref 8–27)
Bilirubin Total: 0.7 mg/dL (ref 0.0–1.2)
CO2: 22 mmol/L (ref 20–29)
Calcium: 9.9 mg/dL (ref 8.6–10.2)
Chloride: 102 mmol/L (ref 96–106)
Creatinine, Ser: 0.8 mg/dL (ref 0.76–1.27)
Globulin, Total: 2.3 g/dL (ref 1.5–4.5)
Glucose: 91 mg/dL (ref 70–99)
Potassium: 4.4 mmol/L (ref 3.5–5.2)
Sodium: 139 mmol/L (ref 134–144)
Total Protein: 7 g/dL (ref 6.0–8.5)
eGFR: 99 mL/min/{1.73_m2} (ref >59)

## 2023-08-29 NOTE — Progress Notes (Signed)
 Chuck,   Kidney function and electrolytes look good.  Normal liver enzymes.

## 2023-08-30 DIAGNOSIS — H9311 Tinnitus, right ear: Secondary | ICD-10-CM | POA: Insufficient documentation

## 2023-09-03 ENCOUNTER — Telehealth: Payer: Self-pay | Admitting: Family Medicine

## 2023-09-03 NOTE — Telephone Encounter (Signed)
 Called and spoke with pt letting him know the recommendations per Dr. Augustus Ledger and he verbalized understanding. Told pt to continue to monitor his BP at home and bring those readings with him when he comes for nurse visit 6/25 and he stated he would. Pt will continue to take current meds as directed and will see what BP is when he comes on 6/25 to determine if any changes with meds needs to be done.

## 2023-09-03 NOTE — Telephone Encounter (Signed)
 Copied from CRM [id number]. Topic: Clinical - Medical Advice >> Sep 03, 2023  8:34 AM Ian Clark wrote: Reason for CRM: Patient request need Advice from [facility] to discuss Blood pressure reading ,Friday was 133/91  Saturday 122/89,125/86, 116/83, 124/88 , Sunday : 129/85 , Monday 148/106 09/03/23 at 5:39 am reading 144/104  Want to discuss maybe medication change   Patient call back [phone]

## 2023-09-11 ENCOUNTER — Ambulatory Visit (INDEPENDENT_AMBULATORY_CARE_PROVIDER_SITE_OTHER)

## 2023-09-11 VITALS — BP 149/94 | HR 84 | Ht 72.0 in | Wt 202.0 lb

## 2023-09-11 DIAGNOSIS — R030 Elevated blood-pressure reading, without diagnosis of hypertension: Secondary | ICD-10-CM | POA: Diagnosis not present

## 2023-09-11 NOTE — Patient Instructions (Addendum)
 Continue taking hydrochlorothiazide , Monitor blood pressures at home. keep a log of b/p readings and use a low sodium diet, Fup with pcp in 1 week  for referral and labs and med refills.

## 2023-09-11 NOTE — Progress Notes (Signed)
 Pt present in office for a blood pressure check, pt had some concerns of blood pressure reading at home,Friday was 133/91 Saturday 122/89,125/86, 116/83, 124/88 , Sunday : 129/85 , Monday 148/106 09/03/23 at 5:39 am reading 144/104, Pt want to discuss possible changing his medications, Pt blood pressure today was:157/98,after sitting for several minutes pt's second reading was 149/94 pt advised to continue taking hydrochlorothiazide , monitor blood pressures at home. keep a log of b/p readings and use a low sodium diet, Fup with pcp in 1 week  for referral and labs and med refills.

## 2023-09-19 ENCOUNTER — Ambulatory Visit (INDEPENDENT_AMBULATORY_CARE_PROVIDER_SITE_OTHER): Admitting: Family Medicine

## 2023-09-19 ENCOUNTER — Other Ambulatory Visit: Payer: Self-pay | Admitting: Physician Assistant

## 2023-09-19 ENCOUNTER — Other Ambulatory Visit: Payer: Self-pay | Admitting: Gastroenterology

## 2023-09-19 ENCOUNTER — Encounter: Payer: Self-pay | Admitting: Family Medicine

## 2023-09-19 VITALS — BP 148/92 | HR 79 | Ht 72.0 in | Wt 198.0 lb

## 2023-09-19 DIAGNOSIS — K589 Irritable bowel syndrome without diarrhea: Secondary | ICD-10-CM | POA: Insufficient documentation

## 2023-09-19 DIAGNOSIS — F411 Generalized anxiety disorder: Secondary | ICD-10-CM

## 2023-09-19 DIAGNOSIS — N528 Other male erectile dysfunction: Secondary | ICD-10-CM | POA: Insufficient documentation

## 2023-09-19 DIAGNOSIS — E782 Mixed hyperlipidemia: Secondary | ICD-10-CM

## 2023-09-19 DIAGNOSIS — I10 Essential (primary) hypertension: Secondary | ICD-10-CM | POA: Diagnosis not present

## 2023-09-19 DIAGNOSIS — H9311 Tinnitus, right ear: Secondary | ICD-10-CM

## 2023-09-19 DIAGNOSIS — R030 Elevated blood-pressure reading, without diagnosis of hypertension: Secondary | ICD-10-CM

## 2023-09-19 DIAGNOSIS — G4700 Insomnia, unspecified: Secondary | ICD-10-CM | POA: Insufficient documentation

## 2023-09-19 DIAGNOSIS — Z125 Encounter for screening for malignant neoplasm of prostate: Secondary | ICD-10-CM | POA: Diagnosis not present

## 2023-09-19 MED ORDER — LORAZEPAM 1 MG PO TABS: ORAL_TABLET | ORAL | 0 refills | Status: AC

## 2023-09-19 NOTE — Progress Notes (Signed)
 Ian Clark - 63 y.o. male MRN [id number]  Date of birth: 1960/12/11  Subjective No chief complaint on file.   HPI Ian Clark is a 63 y.o. male here today for follow up visit.    Last seen by my colleague a few weeks ago for elevated BP.  I last saw him in 2022.   BP is currently managed with hydrochlorothiazide  12.5mg  daily.  Measurements at home have been pretty well controlled, some elevated readings.  Reading today is elevated.  Denies symptoms including chest pain, shortness of breath, palpitations, headache or vision changes.   He has history of anxiety and has used lorazepam  intermittently over the years.  This has worked well for him.  He has not had side effects from this.    ROS:  A comprehensive ROS was completed and negative except as noted per HPI  Allergies  Allergen Reactions   Morphine And Codeine Other (See Comments)    Severe Hypotension    Past Medical History:  Diagnosis Date   GERD (gastroesophageal reflux disease)    Hx of colonic polyps    Hyperlipidemia    PONV (postoperative nausea and vomiting)     Past Surgical History:  Procedure Laterality Date   COLONOSCOPY  2017   prep fair-hyperplastic polyps   EYE SURGERY Left    laser retinal surgery   HERNIA REPAIR     x2   INGUINAL HERNIA REPAIR Left 11/09/2019   Procedure: LAPAROSCOPIC LEFT INGUINAL HERNIA WITH MESH;  Surgeon: Paola Dreama SAILOR, MD;  Location: [facility] OR;  Service: General;  Laterality: Left;   LEFT HEART CATH AND CORONARY ANGIOGRAPHY N/A 12/21/2019   Procedure: LEFT HEART CATH AND CORONARY ANGIOGRAPHY;  Surgeon: Court Dorn PARAS, MD;  Location: [facility];  Service: Cardiovascular;  Laterality: N/A;   RESECTION DISTAL CLAVICAL     metal there   SHOULDER SURGERY Left    TONSILLECTOMY     Ulnar nerve surgery     UPPER GASTROINTESTINAL ENDOSCOPY  2019   WISDOM TOOTH EXTRACTION      Social History   Socioeconomic History   Marital status: Married    Spouse name: Not  on file   Number of children: Not on file   Years of education: Not on file   Highest education level: Some college, no degree  Occupational History   Not on file  Tobacco Use   Smoking status: Former   Smokeless tobacco: Never  Vaping Use   Vaping status: Never Used  Substance and Sexual Activity   Alcohol use: Yes    Alcohol/week: 14.0 standard drinks of alcohol    Types: 14 Standard drinks or equivalent per week   Drug use: Never   Sexual activity: Yes  Other Topics Concern   Not on file  Social History Narrative   Not on file   Social Drivers of Health   Financial Resource Strain: Low Risk  (09/18/2023)   Overall Financial Resource Strain (CARDIA)    Difficulty of Paying Living Expenses: Not hard at all  Food Insecurity: No Food Insecurity (09/18/2023)   Hunger Vital Sign    Worried About Running Out of Food in the Last Year: Never true    Ran Out of Food in the Last Year: Never true  Transportation Needs: No Transportation Needs (09/18/2023)   PRAPARE - Administrator, Civil Service (Medical): No    Lack of Transportation (Non-Medical): No  Physical Activity: Sufficiently Active (09/18/2023)  Exercise Vital Sign    Days of Exercise per Week: 5 days    Minutes of Exercise per Session: 40 min  Stress: No Stress Concern Present (09/18/2023)   [employer] of Occupational Health - Occupational Stress Questionnaire    Feeling of Stress: Not at all  Social Connections: Moderately Isolated (09/18/2023)   Social Connection and Isolation Panel    Frequency of Communication with Friends and Family: Three times a week    Frequency of Social Gatherings with Friends and Family: Twice a week    Attends Religious Services: Never    Database administrator or Organizations: No    Attends Engineer, structural: Not on file    Marital Status: Married    Family History  Problem Relation Age of Onset   Hypertension Mother    Hypertension Father    Stomach cancer  Neg Hx    Esophageal cancer Neg Hx    Pancreatic cancer Neg Hx    Colon cancer Neg Hx     Health Maintenance  Topic Date Due   HIV Screening  Never done   Hepatitis C Screening  Never done   Hepatitis B Vaccines (3 of 3 - 19+ 3-dose series) 05/26/2009   COVID-19 Vaccine (6 - 2024-25 season) 11/18/2022   INFLUENZA VACCINE  10/18/2023   Colonoscopy  07/07/2028   DTaP/Tdap/Td (5 - Td or Tdap) 11/15/2030   Zoster Vaccines- Shingrix  Completed   HPV VACCINES  Aged Out   Meningococcal B Vaccine  Aged Out     ----------------------------------------------------------------------------------------------------------------------------------------------------------------------------------------------------------------- Physical Exam There were no vitals taken for this visit.  Physical Exam Constitutional:      Appearance: Normal appearance.  HENT:     Head: Normocephalic and atraumatic.  Cardiovascular:     Rate and Rhythm: Normal rate and regular rhythm.  Pulmonary:     Effort: Pulmonary effort is normal.     Breath sounds: Normal breath sounds.  Musculoskeletal:     Cervical back: Neck supple.  Neurological:     General: No focal deficit present.     Mental Status: He is alert.  Psychiatric:        Mood and Affect: Mood normal.        Behavior: Behavior normal.     ------------------------------------------------------------------------------------------------------------------------------------------------------------------------------------------------------------------- Assessment and Plan  Anxiety state Anxiety has improved.  Rare use of lorazepam  prn.   Essential hypertension BP has been better controlled at home.  He will continue to monitor home readings.  Low sodium diet encouraged. Recheck BP in 2 weeks.    Hyperlipidemia Coronary calcium  score ordered for risk evaluation   Meds ordered this encounter  Medications   LORazepam  (ATIVAN ) 1 MG tablet    Sig:  Take 1 tab daily as needed.    Dispense:  30 tablet    Refill:  0    Return in about 2 weeks (around 10/03/2023) for nurse visit for BP check.

## 2023-09-19 NOTE — Assessment & Plan Note (Addendum)
 BP has been better controlled at home.  He will continue to monitor home readings.  Low sodium diet encouraged. Recheck BP in 2 weeks.

## 2023-09-19 NOTE — Assessment & Plan Note (Signed)
 Coronary calcium  score ordered for risk evaluation

## 2023-09-19 NOTE — Assessment & Plan Note (Signed)
 Anxiety has improved.  Rare use of lorazepam  prn.

## 2023-09-19 NOTE — Patient Instructions (Signed)
 Managing Your Hypertension Hypertension, also called high blood pressure, is when the force of the blood pressing against the walls of the arteries is too strong. Arteries are blood vessels that carry blood from your heart throughout your body. Hypertension forces the heart to work harder to pump blood and may cause the arteries to become narrow or stiff. Understanding blood pressure readings A blood pressure reading includes a higher number over a lower number: The first, or top, number is called the systolic pressure. It is a measure of the pressure in your arteries as your heart beats. The second, or bottom number, is called the diastolic pressure. It is a measure of the pressure in your arteries as the heart relaxes. For most people, a normal blood pressure is below 120/80. Your personal target blood pressure may vary depending on your medical conditions, your age, and other factors. Blood pressure is classified into four stages. Based on your blood pressure reading, your health care provider may use the following stages to determine what type of treatment you need, if any. Systolic pressure and diastolic pressure are measured in a unit called millimeters of mercury (mmHg). Normal Systolic pressure: below 120. Diastolic pressure: below 80. Elevated Systolic pressure: 120-129. Diastolic pressure: below 80. Hypertension stage 1 Systolic pressure: 130-139. Diastolic pressure: 80-89. Hypertension stage 2 Systolic pressure: 140 or above. Diastolic pressure: 90 or above. How can this condition affect me? Managing your hypertension is very important. Over time, hypertension can damage the arteries and decrease blood flow to parts of the body, including the brain, heart, and kidneys. Having untreated or uncontrolled hypertension can lead to: A heart attack. A stroke. A weakened blood vessel (aneurysm). Heart failure. Kidney damage. Eye damage. Memory and concentration problems. Vascular  dementia. What actions can I take to manage this condition? Hypertension can be managed by making lifestyle changes and possibly by taking medicines. Your health care provider will help you make a plan to bring your blood pressure within a normal range. You may be referred for counseling on a healthy diet and physical activity. Nutrition  Eat a diet that is high in fiber and potassium, and low in salt (sodium), added sugar, and fat. An example eating plan is called the DASH diet. DASH stands for Dietary Approaches to Stop Hypertension. To eat this way: Eat plenty of fresh fruits and vegetables. Try to fill one-half of your plate at each meal with fruits and vegetables. Eat whole grains, such as whole-wheat pasta, brown rice, or whole-grain bread. Fill about one-fourth of your plate with whole grains. Eat low-fat dairy products. Avoid fatty cuts of meat, processed or cured meats, and poultry with skin. Fill about one-fourth of your plate with lean proteins such as fish, chicken without skin, beans, eggs, and tofu. Avoid pre-made and processed foods. These tend to be higher in sodium, added sugar, and fat. Reduce your daily sodium intake. Many people with hypertension should eat less than 1,500 mg of sodium a day. Lifestyle  Work with your health care provider to maintain a healthy body weight or to lose weight. Ask what an ideal weight is for you. Get at least 30 minutes of exercise that causes your heart to beat faster (aerobic exercise) most days of the week. Activities may include walking, swimming, or biking. Include exercise to strengthen your muscles (resistance exercise), such as weight lifting, as part of your weekly exercise routine. Try to do these types of exercises for 30 minutes at least 3 days a week. Do  not use any products that contain nicotine or tobacco. These products include cigarettes, chewing tobacco, and vaping devices, such as e-cigarettes. If you need help quitting, ask your  health care provider. Control any long-term (chronic) conditions you have, such as high cholesterol or diabetes. Identify your sources of stress and find ways to manage stress. This may include meditation, deep breathing, or making time for fun activities. Alcohol use Do not drink alcohol if: Your health care provider tells you not to drink. You are pregnant, may be pregnant, or are planning to become pregnant. If you drink alcohol: Limit how much you have to: 0-1 drink a day for women. 0-2 drinks a day for men. Know how much alcohol is in your drink. In the [place] one drink equals one 12 oz bottle of beer (355 mL), one 5 oz glass of wine (148 mL), or one 1 oz glass of hard liquor (44 mL). Medicines Your health care provider may prescribe medicine if lifestyle changes are not enough to get your blood pressure under control and if: Your systolic blood pressure is 130 or higher. Your diastolic blood pressure is 80 or higher. Take medicines only as told by your health care provider. Follow the directions carefully. Blood pressure medicines must be taken as told by your health care provider. The medicine does not work as well when you skip doses. Skipping doses also puts you at risk for problems. Monitoring Before you monitor your blood pressure: Do not smoke, drink caffeinated beverages, or exercise within 30 minutes before taking a measurement. Use the bathroom and empty your bladder (urinate). Sit quietly for at least 5 minutes before taking measurements. Monitor your blood pressure at home as told by your health care provider. To do this: Sit with your back straight and supported. Place your feet flat on the floor. Do not cross your legs. Support your arm on a flat surface, such as a table. Make sure your upper arm is at heart level. Each time you measure, take two or three readings one minute apart and record the results. You may also need to have your blood pressure checked regularly by  your health care provider. General information Talk with your health care provider about your diet, exercise habits, and other lifestyle factors that may be contributing to hypertension. Review all the medicines you take with your health care provider because there may be side effects or interactions. Keep all follow-up visits. Your health care provider can help you create and adjust your plan for managing your high blood pressure. Where to find more information National Heart, Lung, and Blood Institute: PopSte[employer].is American Heart Association: www.heart.org Contact a health care provider if: You think you are having a reaction to medicines you have taken. You have repeated (recurrent) headaches. You feel dizzy. You have swelling in your ankles. You have trouble with your vision. Get help right away if: You develop a severe headache or confusion. You have unusual weakness or numbness, or you feel faint. You have severe pain in your chest or abdomen. You vomit repeatedly. You have trouble breathing. These symptoms may be an emergency. Get help right away. Call 911. Do not wait to see if the symptoms will go away. Do not drive yourself to the hospital. Summary Hypertension is when the force of blood pumping through your arteries is too strong. If this condition is not controlled, it may put you at risk for serious complications. Your personal target blood pressure may vary depending on your medical conditions,  your age, and other factors. For most people, a normal blood pressure is less than 120/80. Hypertension is managed by lifestyle changes, medicines, or both. Lifestyle changes to help manage hypertension include losing weight, eating a healthy, low-sodium diet, exercising more, stopping smoking, and limiting alcohol. This information is not intended to replace advice given to you by your health care provider. Make sure you discuss any questions you have with your health care  provider. Document Revised: 11/17/2020 Document Reviewed: 11/17/2020 Elsevier Patient Education  2024 [employer].

## 2023-09-20 LAB — CBC WITH DIFFERENTIAL/PLATELET
Basophils Absolute: 0.1 x10E3/uL (ref 0.0–0.2)
Basos: 1 %
EOS (ABSOLUTE): 0.1 x10E3/uL (ref 0.0–0.4)
Eos: 2 %
Hematocrit: 46.3 % (ref 37.5–51.0)
Hemoglobin: 15.4 g/dL (ref 13.0–17.7)
Immature Grans (Abs): 0.1 x10E3/uL (ref 0.0–0.1)
Immature Granulocytes: 1 %
Lymphocytes Absolute: 2 x10E3/uL (ref 0.7–3.1)
Lymphs: 25 %
MCH: 30.9 pg (ref 26.6–33.0)
MCHC: 33.3 g/dL (ref 31.5–35.7)
MCV: 93 fL (ref 79–97)
Monocytes Absolute: 0.7 x10E3/uL (ref 0.1–0.9)
Monocytes: 9 %
Neutrophils Absolute: 4.9 x10E3/uL (ref 1.4–7.0)
Neutrophils: 62 %
Platelets: 303 x10E3/uL (ref 150–450)
RBC: 4.98 x10E6/uL (ref 4.14–5.80)
RDW: 12.5 % (ref 11.6–15.4)
WBC: 7.8 x10E3/uL (ref 3.4–10.8)

## 2023-09-20 LAB — PSA: Prostate Specific Ag, Serum: 0.6 ng/mL (ref 0.0–4.0)

## 2023-09-20 LAB — LIPID PANEL WITH LDL/HDL RATIO
Cholesterol, Total: 248 mg/dL — ABNORMAL HIGH (ref 100–199)
HDL: 77 mg/dL (ref >39)
LDL Chol Calc (NIH): 152 mg/dL — ABNORMAL HIGH (ref 0–99)
LDL/HDL Ratio: 2 ratio (ref 0.0–3.6)
Triglycerides: 111 mg/dL (ref 0–149)
VLDL Cholesterol Cal: 19 mg/dL (ref 5–40)

## 2023-09-20 LAB — TSH: TSH: 0.805 u[IU]/mL (ref 0.450–4.500)

## 2023-09-23 ENCOUNTER — Ambulatory Visit (INDEPENDENT_AMBULATORY_CARE_PROVIDER_SITE_OTHER): Admitting: Gastroenterology

## 2023-09-23 ENCOUNTER — Encounter: Payer: Self-pay | Admitting: Gastroenterology

## 2023-09-23 ENCOUNTER — Other Ambulatory Visit (INDEPENDENT_AMBULATORY_CARE_PROVIDER_SITE_OTHER)

## 2023-09-23 VITALS — BP 136/80 | HR 73 | Ht 71.0 in | Wt 202.0 lb

## 2023-09-23 DIAGNOSIS — K2100 Gastro-esophageal reflux disease with esophagitis, without bleeding: Secondary | ICD-10-CM

## 2023-09-23 LAB — MAGNESIUM: Magnesium: 1.8 mg/dL (ref 1.5–2.5)

## 2023-09-23 LAB — VITAMIN D 25 HYDROXY (VIT D DEFICIENCY, FRACTURES): VITD: 39.48 ng/mL (ref 30.00–100.00)

## 2023-09-23 LAB — IBC + FERRITIN
Ferritin: 108 ng/mL (ref 22.0–322.0)
Iron: 69 ug/dL (ref 42–165)
Saturation Ratios: 22.6 % (ref 20.0–50.0)
TIBC: 305.2 ug/dL (ref 250.0–450.0)
Transferrin: 218 mg/dL (ref 212.0–360.0)

## 2023-09-23 LAB — VITAMIN B12: Vitamin B-12: 397 pg/mL (ref 211–911)

## 2023-09-23 MED ORDER — ESOMEPRAZOLE MAGNESIUM 40 MG PO CPDR: 40.0000 mg | DELAYED_RELEASE_CAPSULE | Freq: Every day | ORAL | 3 refills | Status: AC

## 2023-09-23 NOTE — Progress Notes (Signed)
 Discussed the use of AI scribe software for clinical note transcription with the patient, who gave verbal consent to proceed.  HPI : Ian Clark is a 63 year old male with gastroesophageal reflux disease (GERD) who presents with persistent reflux symptoms despite medication.  He experiences persistent reflux symptoms despite being on pantoprazole . His symptoms are generally controlled with the medication, but he has significant flare-ups when consuming certain foods, particularly rich foods in the evening. These episodes often wake him up around 1:30 AM with severe reflux symptoms. Attempts to discontinue pantoprazole  result in 'weird little burps' and dramatic symptom recurrence, necessitating resumption of the medication.  He supplements pantoprazole  with Tums and occasionally uses over-the-counter acid reducers to prevent incidents when he suspects he has eaten something problematic. Despite these measures, unexpected flare-ups are described as 'brutal', leading to significant discomfort and inability to lie down, sometimes resulting in pacing around the room.  He recalls previously using a prescription medication, possibly Nexium , which was effective but expensive. He is concerned about long-term medication use and potential side effects, although he avoids researching these due to anxiety about medical risks.  He notes that weight loss seems to improve his symptoms.  He would like to consider alternatives to GERD management such as fundoplication He is retired but remains active, running a horse barn, which involves significant physical activity. He finds it challenging to remain inactive for extended periods, which would be required for recovery from fundoplication.  He underwent an EGD in 2019 which showed LA Grade esophagitis and a 3 cm hiatal hernia He states that an upper GI series also demonstrated reflux. He had a CT scan last year to evaluate abdominal pain which showed  small hernias in the L inguinal ring and umbilicus.  A duodenal diverticulum was noted.  A CT in 2019 was also unremarkable.  Colonoscopy April 2023 2 mm polyp AC 4 mm polyp TV 3 mm polyp TV Pathology report showed benign colonic mucosa.  No adenomatous changes Repeat in 7 years due to prior history of precancerous polyps    Past Medical History:  Diagnosis Date   GERD (gastroesophageal reflux disease)    Hx of colonic polyps    Hyperlipidemia    PONV (postoperative nausea and vomiting)      Past Surgical History:  Procedure Laterality Date   COLONOSCOPY  2017   prep fair-hyperplastic polyps   EYE SURGERY Left    laser retinal surgery   HERNIA REPAIR     x2   INGUINAL HERNIA REPAIR Left 11/09/2019   Procedure: LAPAROSCOPIC LEFT INGUINAL HERNIA WITH MESH;  Surgeon: Paola Dreama SAILOR, MD;  Location: [facility] OR;  Service: General;  Laterality: Left;   LEFT HEART CATH AND CORONARY ANGIOGRAPHY N/A 12/21/2019   Procedure: LEFT HEART CATH AND CORONARY ANGIOGRAPHY;  Surgeon: Court Dorn PARAS, MD;  Location: [facility];  Service: Cardiovascular;  Laterality: N/A;   RESECTION DISTAL CLAVICAL     metal there   SHOULDER SURGERY Left    TONSILLECTOMY     Ulnar nerve surgery     UPPER GASTROINTESTINAL ENDOSCOPY  2019   WISDOM TOOTH EXTRACTION     Family History  Problem Relation Age of Onset   Hypertension Mother    Hypertension Father    Stomach cancer Neg Hx    Esophageal cancer Neg Hx    Pancreatic cancer Neg Hx    Colon cancer Neg Hx    Social History   Tobacco Use  Smoking status: Former   Smokeless tobacco: Never  Vaping Use   Vaping status: Never Used  Substance Use Topics   Alcohol use: Yes    Alcohol/week: 14.0 standard drinks of alcohol    Types: 14 Standard drinks or equivalent per week   Drug use: Never   Current Outpatient Medications  Medication Sig Dispense Refill   aspirin  EC 81 MG tablet Take 1 tablet (81 mg total) by mouth daily. Swallow whole.  90 tablet 3   b complex vitamins tablet Take 1 tablet by mouth daily.     BL GLUCOSAMINE-CHONDROITIN PO Take by mouth.     hydrochlorothiazide  (HYDRODIURIL ) 12.5 MG tablet Take 1 tablet (12.5 mg total) by mouth daily. 30 tablet 0   LORazepam  (ATIVAN ) 1 MG tablet Take 1 tab daily as needed. 30 tablet 0   Multiple Vitamins-Minerals (MULTIVITAMIN ADULTS PO) Take 1 tablet by mouth daily.     Omega-3 Fatty Acids (FISH OIL) 1000 MG CAPS Take 1 capsule by mouth daily.     pantoprazole  (PROTONIX ) 40 MG tablet TAKE 1 TABLET BY MOUTH DAILY. PLEASE KEEP YOUR JULY APPOINTMENT FOR ANY FURTHER REFILLS. 90 tablet 1   sildenafil (VIAGRA) 100 MG tablet Take by mouth.     fluticasone  (FLONASE ) 50 MCG/ACT nasal spray Place 2 sprays into both nostrils daily. (Patient not taking: Reported on 09/23/2023) 16 g 0   methocarbamol  (ROBAXIN -750) 750 MG tablet Take 1 tablet (750 mg total) by mouth every 6 (six) hours as needed for muscle spasms. (Patient not taking: Reported on 09/23/2023) 20 tablet 0   No current facility-administered medications for this visit.   Allergies  Allergen Reactions   Morphine And Codeine Other (See Comments)    Severe Hypotension     Review of Systems: All systems reviewed and negative except where noted in HPI.    No results found.  Physical Exam: BP 136/80 (BP Location: Left Arm, Patient Position: Sitting, Cuff Size: Normal)   Pulse 73   Ht 5' 11 (1.803 m)   Wt 202 lb (91.6 kg)   BMI 28.17 kg/m  Constitutional: Pleasant,well-developed, Caucasian male in no acute distress. HEENT: Normocephalic and atraumatic. Conjunctivae are normal. No scleral icterus. Neurological: Alert and oriented to person place and time. Skin: Skin is warm and dry. No rashes noted. Psychiatric: Normal mood and affect. Behavior is normal.  CBC    Component Value Date/Time   WBC 7.8 09/19/2023 0945   WBC 7.5 11/17/2020 0000   RBC 4.98 09/19/2023 0945   RBC 5.02 11/17/2020 0000   HGB 15.4 09/19/2023  0945   HCT 46.3 09/19/2023 0945   PLT 303 09/19/2023 0945   MCV 93 09/19/2023 0945   MCH 30.9 09/19/2023 0945   MCH 29.9 11/17/2020 0000   MCHC 33.3 09/19/2023 0945   MCHC 32.5 11/17/2020 0000   RDW 12.5 09/19/2023 0945   LYMPHSABS 2.0 09/19/2023 0945   EOSABS 0.1 09/19/2023 0945   BASOSABS 0.1 09/19/2023 0945    CMP     Component Value Date/Time   NA 139 08/28/2023 1339   K 4.4 08/28/2023 1339   CL 102 08/28/2023 1339   CO2 22 08/28/2023 1339   GLUCOSE 91 08/28/2023 1339   GLUCOSE 101 (H) 11/17/2020 0000   BUN 11 08/28/2023 1339   CREATININE 0.80 08/28/2023 1339   CREATININE 0.67 (L) 11/17/2020 0000   CALCIUM  9.9 08/28/2023 1339   PROT 7.0 08/28/2023 1339   ALBUMIN 4.7 08/28/2023 1339   AST 26 08/28/2023 1339  ALT 34 08/28/2023 1339   ALKPHOS 91 08/28/2023 1339   BILITOT 0.7 08/28/2023 1339   GFRNONAA 100 12/17/2019 1425   GFRAA 115 12/17/2019 1425       Latest Ref Rng & Units 09/19/2023    9:45 AM 11/17/2020   12:00 AM 12/17/2019    2:25 PM  CBC EXTENDED  WBC 3.4 - 10.8 x10E3/uL 7.8  7.5  8.0   RBC 4.14 - 5.80 x10E6/uL 4.98  5.02  4.82   Hemoglobin 13.0 - 17.7 g/dL 84.5  84.9  85.0   HCT 37.5 - 51.0 % 46.3  46.1  43.6   Platelets 150 - 450 x10E3/uL 303  298  304   NEUT# 1.4 - 7.0 x10E3/uL 4.9  5,378  5.2   Lymph# 0.7 - 3.1 x10E3/uL 2.0  1,275  1.9       ASSESSMENT AND PLAN:   GERD  Chronic GERD with history of esophagitis, symptoms persist despite pantoprazole .  I have reviewed the indications, risks, and benefits of PPI therapy with the patient today. I have discussed studies that suggest an increased risk of osteoporosis/pathologic fracture and kidney disease.  I also discussed studies suggesting other potential adverse consequences such as increased risk of dementia, CAD and CVA and explained that these studies show very weak associations of unclear significance and not clear cause and effect. We did discuss the potential for vitamin malabsorption, to  include magnesium  (very rare), calcium  (easily modifiable with Calcium  Citrate supplement), vitamin B12 (again, correctable with oral B12 supplement), and iron (although rarely clinically significant outside patients who require iron supplementation previously), and can monitor each of these periodically with routine labs.  We discussed the role of TIF/fundoplication in treating GERD, but the patient is concerned he would have difficulty avoiding heavy/strenuous physical activity for 6 weeks after the procedure.   We agreed to trial a different PPI and focus on additional dietary/behavioral modifications and weight loss. - Discontinue pantoprazole . - Prescribe Nexium  (esomeprazole ) 40 mg daily, 30-45 min before a meal. - Order iron panel, vitamin D , B12, and magnesium  levels. - Provide GERD diet handout. - Recommend Gaviscon at night if he has trigger foods or a late meal. - Discuss dietary and lifestyle modifications to help manage GERD - Reconsider TIF/hiatal hernia repair as needed.  Recording duration: 16 minutes     Candy Leverett E. Stacia, MD [facility]    Alvia Bring, DO

## 2023-09-23 NOTE — Patient Instructions (Addendum)
 Your provider has requested that you go to the basement level for lab work before leaving today. Press B on the elevator. The lab is located at the first door on the left as you exit the elevator.  We have sent the following medications to your pharmacy for you to pick up at your convenience: Esomeprazole    If your blood pressure at your visit was 140/90 or greater, please contact your primary care physician to follow up on this.  _______________________________________________________  If you are age 1 or older, your body mass index should be between 23-30. Your Body mass index is 28.17 kg/m. If this is out of the aforementioned range listed, please consider follow up with your Primary Care Provider.  If you are age 56 or younger, your body mass index should be between 19-25. Your Body mass index is 28.17 kg/m. If this is out of the aformentioned range listed, please consider follow up with your Primary Care Provider.   ________________________________________________________  The [facility] providers would like to encourage you to use MYCHART to communicate with providers for non-urgent requests or questions.  Due to long hold times on the telephone, sending your provider a message by [facility] may be a faster and more efficient way to get a response.  Please allow 48 business hours for a response.  Please remember that this is for non-urgent requests.  _______________________________________________________  Thank you for entrusting me with your care and choosing [facility].  Dr Stacia

## 2023-09-24 ENCOUNTER — Ambulatory Visit (INDEPENDENT_AMBULATORY_CARE_PROVIDER_SITE_OTHER): Payer: Self-pay

## 2023-09-24 DIAGNOSIS — E782 Mixed hyperlipidemia: Secondary | ICD-10-CM

## 2023-09-24 DIAGNOSIS — I10 Essential (primary) hypertension: Secondary | ICD-10-CM

## 2023-09-25 ENCOUNTER — Ambulatory Visit: Payer: Self-pay | Admitting: Family Medicine

## 2023-09-25 ENCOUNTER — Telehealth: Payer: Self-pay

## 2023-09-25 NOTE — Telephone Encounter (Signed)
Results not yet reviewed by provider

## 2023-09-25 NOTE — Telephone Encounter (Signed)
 Copied from CRM [id number]. Topic: Clinical - Lab/Test Results >> Sep 25, 2023  9:33 AM Kevelyn M wrote: Reason for CRM: Patient is calling in about Cardio scoring test. Please give patient a call back. [phone].

## 2023-09-26 ENCOUNTER — Ambulatory Visit: Payer: Self-pay | Admitting: Gastroenterology

## 2023-09-26 NOTE — Progress Notes (Signed)
 Mr. Sabino,  Your labs looked great.  No indication of impaired absorption of vitamins from your PPI.  Please continue taking the Nexium  as planned.

## 2023-09-27 MED ORDER — ROSUVASTATIN CALCIUM 20 MG PO TABS: 20.0000 mg | ORAL_TABLET | Freq: Every day | ORAL | 1 refills | Status: DC

## 2023-10-03 ENCOUNTER — Other Ambulatory Visit: Payer: Self-pay | Admitting: Family Medicine

## 2023-10-03 ENCOUNTER — Ambulatory Visit (INDEPENDENT_AMBULATORY_CARE_PROVIDER_SITE_OTHER)

## 2023-10-03 VITALS — BP 141/96 | HR 78 | Resp 20 | Ht 71.0 in | Wt 202.0 lb

## 2023-10-03 DIAGNOSIS — R030 Elevated blood-pressure reading, without diagnosis of hypertension: Secondary | ICD-10-CM

## 2023-10-03 DIAGNOSIS — I10 Essential (primary) hypertension: Secondary | ICD-10-CM

## 2023-10-03 MED ORDER — HYDROCHLOROTHIAZIDE 25 MG PO TABS: 25.0000 mg | ORAL_TABLET | Freq: Every day | ORAL | 1 refills | Status: DC

## 2023-10-03 NOTE — Progress Notes (Signed)
 Pt here for BP check. First reading 144/92. Second 141/96. Pt takes 12.5mg  of hydrochlorothiazide . Dr alvia increased to 25mg  a day. Pt advised to RTC in 2 wks.

## 2023-10-17 ENCOUNTER — Ambulatory Visit (INDEPENDENT_AMBULATORY_CARE_PROVIDER_SITE_OTHER)

## 2023-10-17 VITALS — BP 145/82 | HR 67

## 2023-10-17 DIAGNOSIS — I10 Essential (primary) hypertension: Secondary | ICD-10-CM

## 2023-10-17 MED ORDER — LOSARTAN POTASSIUM 25 MG PO TABS: 25.0000 mg | ORAL_TABLET | Freq: Every day | ORAL | 0 refills | Status: DC

## 2023-10-17 NOTE — Addendum Note (Signed)
 Addended by: Renelda Kilian A on: 10/17/2023 10:58 AM   Modules accepted: Orders

## 2023-10-17 NOTE — Patient Instructions (Signed)
 Patient informed to schedule next NV appointment in 2 weeks

## 2023-10-17 NOTE — Progress Notes (Signed)
 Pt present in office for a blood pressure check, Pt has had a adjustment in medications pt was taken htz 12.5 mg and and has had his medications increased to 25mg  htz, Pt blood pressure today was:143/97,after sitting for several minutes pt's second reading was 145/82 pt advised to continue taking hydrochlorothiazide ,along with losartan  25 mg daily  monitor blood pressures at home. And keep a log of b/p readings and use a low sodium diet.   Patient informed to schedule next NV appointment in 2 weeks for b/p and labs. Labs have been placed for next visit

## 2023-10-17 NOTE — Addendum Note (Signed)
 Addended byBETHA WILLO MINI on: 10/17/2023 11:39 AM   Modules accepted: Orders

## 2023-10-31 ENCOUNTER — Ambulatory Visit (INDEPENDENT_AMBULATORY_CARE_PROVIDER_SITE_OTHER)

## 2023-10-31 VITALS — BP 137/84 | HR 78

## 2023-10-31 DIAGNOSIS — I10 Essential (primary) hypertension: Secondary | ICD-10-CM

## 2023-10-31 DIAGNOSIS — R030 Elevated blood-pressure reading, without diagnosis of hypertension: Secondary | ICD-10-CM

## 2023-10-31 NOTE — Progress Notes (Signed)
 Pt present in office for a blood pressure check, Pt has had a adjustment in medications pt was taken htz 12.5 mg and and has had his medications increased to 25mg  htz, Pt blood pressure today was:149/89,after sitting for several minutes pt's second reading was 137/84 pt advised to continue taking hydrochlorothiazide  And keep a log of b/p readings and use a low sodium diet.

## 2023-10-31 NOTE — Patient Instructions (Signed)
 Return to see pcp for follow up on appt on htn.

## 2023-11-08 ENCOUNTER — Other Ambulatory Visit: Payer: Self-pay | Admitting: Medical-Surgical

## 2023-11-08 DIAGNOSIS — R030 Elevated blood-pressure reading, without diagnosis of hypertension: Secondary | ICD-10-CM | POA: Diagnosis not present

## 2023-11-08 DIAGNOSIS — G243 Spasmodic torticollis: Secondary | ICD-10-CM | POA: Diagnosis not present

## 2023-11-28 DIAGNOSIS — H43813 Vitreous degeneration, bilateral: Secondary | ICD-10-CM | POA: Diagnosis not present

## 2024-03-20 ENCOUNTER — Other Ambulatory Visit: Payer: Self-pay | Admitting: Family Medicine

## 2024-03-20 DIAGNOSIS — E782 Mixed hyperlipidemia: Secondary | ICD-10-CM

## 2024-03-20 DIAGNOSIS — I10 Essential (primary) hypertension: Secondary | ICD-10-CM

## 2024-03-20 DIAGNOSIS — R030 Elevated blood-pressure reading, without diagnosis of hypertension: Secondary | ICD-10-CM

## 2024-03-23 MED ORDER — LOSARTAN POTASSIUM 25 MG PO TABS: 25.0000 mg | ORAL_TABLET | Freq: Every day | ORAL | 1 refills | Status: DC

## 2024-03-26 ENCOUNTER — Ambulatory Visit (INDEPENDENT_AMBULATORY_CARE_PROVIDER_SITE_OTHER): Admitting: Family Medicine

## 2024-03-26 ENCOUNTER — Encounter: Payer: Self-pay | Admitting: Family Medicine

## 2024-03-26 VITALS — BP 134/85 | HR 81 | Ht 71.0 in | Wt 203.0 lb

## 2024-03-26 DIAGNOSIS — I10 Essential (primary) hypertension: Secondary | ICD-10-CM | POA: Diagnosis not present

## 2024-03-26 DIAGNOSIS — Z23 Encounter for immunization: Secondary | ICD-10-CM

## 2024-03-26 DIAGNOSIS — K635 Polyp of colon: Secondary | ICD-10-CM | POA: Insufficient documentation

## 2024-03-26 MED ORDER — LOSARTAN POTASSIUM 50 MG PO TABS: 50.0000 mg | ORAL_TABLET | Freq: Every day | ORAL | 1 refills | Status: AC

## 2024-03-26 NOTE — Progress Notes (Signed)
 " Ian Clark - 64 y.o. male MRN [id number]  Date of birth: 1960/12/04  Subjective Chief Complaint  Patient presents with   Hypertension    HPI Ian Clark is a 65 y.o. male here today for follow up visit.   He reports that he is doing well.   He continues on losartan  and hydrochlorothiazide .  Doing well with combination but has has increased urinary frequency which he really doesn't care for.   BP is elevated on initial check.  Normal readings at home and at dentist recently.  He has not had chest pain, shortness of breath, palpitations, headache or vision changes.   ROS:  A comprehensive ROS was completed and negative except as noted per HPI  Allergies[1]  Past Medical History:  Diagnosis Date   GERD (gastroesophageal reflux disease)    Hx of colonic polyps    Hyperlipidemia    PONV (postoperative nausea and vomiting)     Past Surgical History:  Procedure Laterality Date   COLONOSCOPY  2017   prep fair-hyperplastic polyps   EYE SURGERY Left    laser retinal surgery   HERNIA REPAIR     x2   INGUINAL HERNIA REPAIR Left 11/09/2019   Procedure: LAPAROSCOPIC LEFT INGUINAL HERNIA WITH MESH;  Surgeon: Paola Dreama SAILOR, MD;  Location: [facility] OR;  Service: General;  Laterality: Left;   LEFT HEART CATH AND CORONARY ANGIOGRAPHY N/A 12/21/2019   Procedure: LEFT HEART CATH AND CORONARY ANGIOGRAPHY;  Surgeon: Court Dorn PARAS, MD;  Location: [facility] INVASIVE CV LAB;  Service: Cardiovascular;  Laterality: N/A;   RESECTION DISTAL CLAVICAL     metal there   SHOULDER SURGERY Left    TONSILLECTOMY     Ulnar nerve surgery     UPPER GASTROINTESTINAL ENDOSCOPY  2019   WISDOM TOOTH EXTRACTION      Social History   Socioeconomic History   Marital status: Married    Spouse name: Not on file   Number of children: Not on file   Years of education: Not on file   Highest education level: Some college, no degree  Occupational History   Not on file  Tobacco Use   Smoking status: Former    Smokeless tobacco: Never  Vaping Use   Vaping status: Never Used  Substance and Sexual Activity   Alcohol use: Yes    Alcohol/week: 14.0 standard drinks of alcohol    Types: 14 Standard drinks or equivalent per week   Drug use: Never   Sexual activity: Yes  Other Topics Concern   Not on file  Social History Narrative   Not on file   Social Drivers of Health   Tobacco Use: Medium Risk (03/26/2024)   Patient History    Smoking Tobacco Use: Former    Smokeless Tobacco Use: Never    Passive Exposure: Not on file  Financial Resource Strain: Low Risk (03/25/2024)   Overall Financial Resource Strain (CARDIA)    Difficulty of Paying Living Expenses: Not hard at all  Food Insecurity: No Food Insecurity (03/25/2024)   Epic    Worried About Radiation Protection Practitioner of Food in the Last Year: Never true    Ran Out of Food in the Last Year: Never true  Transportation Needs: No Transportation Needs (03/25/2024)   Epic    Lack of Transportation (Medical): No    Lack of Transportation (Non-Medical): No  Physical Activity: Sufficiently Active (03/25/2024)   Exercise Vital Sign    Days of Exercise per Week: 5 days  Minutes of Exercise per Session: 60 min  Stress: No Stress Concern Present (03/25/2024)   [employer] of Occupational Health - Occupational Stress Questionnaire    Feeling of Stress: Not at all  Social Connections: Moderately Isolated (03/25/2024)   Social Connection and Isolation Panel    Frequency of Communication with Friends and Family: More than three times a week    Frequency of Social Gatherings with Friends and Family: Once a week    Attends Religious Services: Never    Database Administrator or Organizations: No    Attends Engineer, Structural: Not on file    Marital Status: Married  Depression (PHQ2-9): Low Risk (03/26/2024)   Depression (PHQ2-9)    PHQ-2 Score: 0  Alcohol Screen: Low Risk (03/25/2024)   Alcohol Screen    Last Alcohol Screening Score (AUDIT): 5  Housing: Low  Risk (03/25/2024)   Epic    Unable to Pay for Housing in the Last Year: No    Number of Times Moved in the Last Year: 0    Homeless in the Last Year: No  Utilities: Not At Risk (09/19/2023)   Epic    Threatened with loss of utilities: No  Health Literacy: Adequate Health Literacy (09/19/2023)   B1300 Health Literacy    Frequency of need for help with medical instructions: Never    Family History  Problem Relation Age of Onset   Hypertension Mother    Hypertension Father    Stomach cancer Neg Hx    Esophageal cancer Neg Hx    Pancreatic cancer Neg Hx    Colon cancer Neg Hx     Health Maintenance  Topic Date Due   Influenza Vaccine  10/18/2023   COVID-19 Vaccine (6 - 2025-26 season) 11/16/2024 (Originally 11/18/2023)   Pneumococcal Vaccine: 50+ Years (1 of 1 - PCV) 03/26/2025 (Originally 06/05/2009)   Hepatitis B Vaccines 19-59 Average Risk (3 of 3 - 19+ 3-dose series) 03/26/2025 (Originally 05/26/2009)   Hepatitis C Screening  03/26/2025 (Originally 06/05/1977)   HIV Screening  03/26/2025 (Originally 06/06/1974)   Colonoscopy  07/07/2028   DTaP/Tdap/Td (5 - Td or Tdap) 11/15/2030   Zoster Vaccines- Shingrix  Completed   HPV VACCINES  Aged Out   Meningococcal B Vaccine  Aged Out     ----------------------------------------------------------------------------------------------------------------------------------------------------------------------------------------------------------------- Physical Exam BP (!) 165/93 (BP Location: Left Arm, Patient Position: Sitting, Cuff Size: Large)   Pulse 81   Ht 5' 11 (1.803 m)   Wt 203 lb (92.1 kg)   SpO2 100%   BMI 28.31 kg/m   Physical Exam Constitutional:      Appearance: Normal appearance.  Eyes:     General: No scleral icterus. Cardiovascular:     Rate and Rhythm: Normal rate and regular rhythm.  Pulmonary:     Effort: Pulmonary effort is normal.     Breath sounds: Normal breath sounds.  Neurological:     Mental Status: He is  alert.  Psychiatric:        Mood and Affect: Mood normal.        Behavior: Behavior normal.     ------------------------------------------------------------------------------------------------------------------------------------------------------------------------------------------------------------------- Assessment and Plan  Essential hypertension Having side effect related to hydrochlorothiazide .  Increase losartan  and drop hydrochlorothiazide .  Low sodium diet encouraged.  Return in 3 weeks for BP check w/ nurse.    Meds ordered this encounter  Medications   losartan  (COZAAR ) 50 MG tablet    Sig: Take 1 tablet (50 mg total) by mouth daily.  Dispense:  90 tablet    Refill:  1    Return in about 3 weeks (around 04/16/2024) for nurse visit for BP check.        [1]  Allergies Allergen Reactions   Morphine And Codeine Other (See Comments)    Severe Hypotension   "

## 2024-03-26 NOTE — Patient Instructions (Signed)
 Increase losartan  to 50mg  daily.  Stop hydrochlorothiazide .  Return in 3 weeks for BP check

## 2024-03-26 NOTE — Assessment & Plan Note (Signed)
 Having side effect related to hydrochlorothiazide .  Increase losartan  and drop hydrochlorothiazide .  Low sodium diet encouraged.  Return in 3 weeks for BP check w/ nurse.

## 2024-04-16 ENCOUNTER — Ambulatory Visit (INDEPENDENT_AMBULATORY_CARE_PROVIDER_SITE_OTHER)

## 2024-04-16 VITALS — BP 143/73 | HR 91 | Resp 17 | Ht 71.0 in | Wt 203.0 lb

## 2024-04-16 DIAGNOSIS — I10 Essential (primary) hypertension: Secondary | ICD-10-CM | POA: Diagnosis not present

## 2024-04-16 NOTE — Progress Notes (Signed)
" ° °  Subjective:    Patient ID: Ian Clark, male    DOB: 03/27/1960, 64 y.o.   MRN: [id number]  HPI  Patient is here for a 3 wk BP recheck. Patient currently take losartan  50 mg. It was increased from 25mg . Dr. Alvia also discontinued  his  hydrochlorothiazide  last OV.  He has taken his losartan  around 630 am this morning. Denies CP, SOB, heart palpitations, or vision changes.  Review of Systems     Objective:   Physical Exam        Assessment & Plan:   Patients first BP reading is 164/92. Second is 143/73. Reported to Dr. Donnice who would like patient to continue same regimen, monitor at home anf f/u with him in 6 months. "

## 2024-10-14 ENCOUNTER — Ambulatory Visit: Admitting: Family Medicine
